# Patient Record
Sex: Female | Born: 1990 | Race: White | Hispanic: No | Marital: Married | Smoking: Never smoker
Health system: Southern US, Community
[De-identification: ages and names within clinical notes are randomized; demographics above are authoritative.]

## PROBLEM LIST (undated history)

## (undated) HISTORY — PX: KNEE SURGERY: SHX244

## (undated) HISTORY — PX: TONSILLECTOMY: SUR1361

---
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
## 2012-12-30 ENCOUNTER — Encounter (HOSPITAL_COMMUNITY): Payer: Self-pay | Admitting: *Deleted

## 2012-12-30 ENCOUNTER — Emergency Department (INDEPENDENT_AMBULATORY_CARE_PROVIDER_SITE_OTHER)
Admission: EM | Admit: 2012-12-30 | Discharge: 2012-12-30 | Disposition: A | Discharge status: Home / Self Care | Payer: BC Managed Care – PPO | Source: Home / Self Care | Attending: Family Medicine | Admitting: Family Medicine

## 2012-12-30 DIAGNOSIS — J111 Influenza due to unidentified influenza virus with other respiratory manifestations: Secondary | ICD-10-CM

## 2012-12-30 NOTE — ED Notes (Signed)
Pt    Reports       Symptoms     Of    Cough  Congested  Body  Aches        Malaise                 Symptoms  Have   Persisted        X    sev  Weeks                She  Is    Wearing            A  Mask                 And  Is  In a  Private  Room         She   Is  Speaking in  Complete  sentances   And is  In no severe  Distress

## 2012-12-30 NOTE — ED Provider Notes (Signed)
History     CSN: [id number]  Arrival date & time 12/30/12  1446   First MD Initiated Contact with Patient 12/30/12 1521      Chief Complaint  Patient presents with  . URI    (Consider location/radiation/quality/duration/timing/severity/associated sxs/prior treatment) Patient is a 22 y.o. female presenting with URI. The history is provided by the patient.  URI The primary symptoms include fever, fatigue, cough, nausea and myalgias. Primary symptoms do not include sore throat, vomiting or rash. The current episode started more than 1 week ago. This is a new problem. The problem has not changed since onset. The onset of the illness is associated with exposure to sick contacts. Symptoms associated with the illness include chills, congestion and rhinorrhea.    History reviewed. No pertinent past medical history.  History reviewed. No pertinent past surgical history.  No family history on file.  History  Substance Use Topics  . Smoking status: Current Every Day Smoker  . Smokeless tobacco: Not on file  . Alcohol Use: Yes    OB History    Grav Para Term Preterm Abortions TAB SAB Ect Mult Living                  Review of Systems  Constitutional: Positive for fever, chills and fatigue.  HENT: Positive for congestion, rhinorrhea and postnasal drip. Negative for sore throat.   Respiratory: Positive for cough.   Gastrointestinal: Positive for nausea. Negative for vomiting.  Musculoskeletal: Positive for myalgias.  Skin: Negative for rash.    Allergies  Ceclor and Penicillins  Home Medications  No current outpatient prescriptions on file.  BP 122/80  Pulse 72  Temp 98.6 F (37 C) (Oral)  Resp 16  SpO2 100%  LMP 12/02/2012  Physical Exam  Nursing note and vitals reviewed. Constitutional: She is oriented to person, place, and time. She appears well-developed and well-nourished.  HENT:  Head: Normocephalic.  Right Ear: External ear normal.  Left Ear: External ear  normal.  Mouth/Throat: Oropharynx is clear and moist.  Eyes: Pupils are equal, round, and reactive to light.  Neck: Normal range of motion. Neck supple.  Cardiovascular: Normal rate, regular rhythm, normal heart sounds and intact distal pulses.   Pulmonary/Chest: Effort normal and breath sounds normal.  Abdominal: Soft. Bowel sounds are normal.  Lymphadenopathy:    She has no cervical adenopathy.  Neurological: She is alert and oriented to person, place, and time.  Skin: Skin is warm and dry.    ED Course  Procedures (including critical care time)  Labs Reviewed - No data to display No results found.   1. Influenza-like illness       MDM          Linna Hoff, MD 12/30/12 [phone]

## 2014-01-23 ENCOUNTER — Emergency Department (HOSPITAL_COMMUNITY)
Admission: EM | Admit: 2014-01-23 | Discharge: 2014-01-23 | Disposition: A | Discharge status: Home / Self Care | Payer: 59 | Source: Home / Self Care | Attending: Emergency Medicine | Admitting: Emergency Medicine

## 2014-01-23 ENCOUNTER — Emergency Department (HOSPITAL_COMMUNITY): Payer: 59

## 2014-01-23 ENCOUNTER — Encounter (HOSPITAL_COMMUNITY): Payer: Self-pay | Admitting: Emergency Medicine

## 2014-01-23 ENCOUNTER — Emergency Department (INDEPENDENT_AMBULATORY_CARE_PROVIDER_SITE_OTHER): Payer: 59

## 2014-01-23 DIAGNOSIS — M25569 Pain in unspecified knee: Secondary | ICD-10-CM

## 2014-01-23 DIAGNOSIS — M25562 Pain in left knee: Secondary | ICD-10-CM

## 2014-01-23 IMAGING — CR DG KNEE COMPLETE 4+V*L*
1 series · 1 of 1 positions shown · non-contrast
Comparison: None.

CLINICAL DATA: Left knee pain.

EXAM:
LEFT KNEE - COMPLETE 4+ VIEW

[view not recorded]
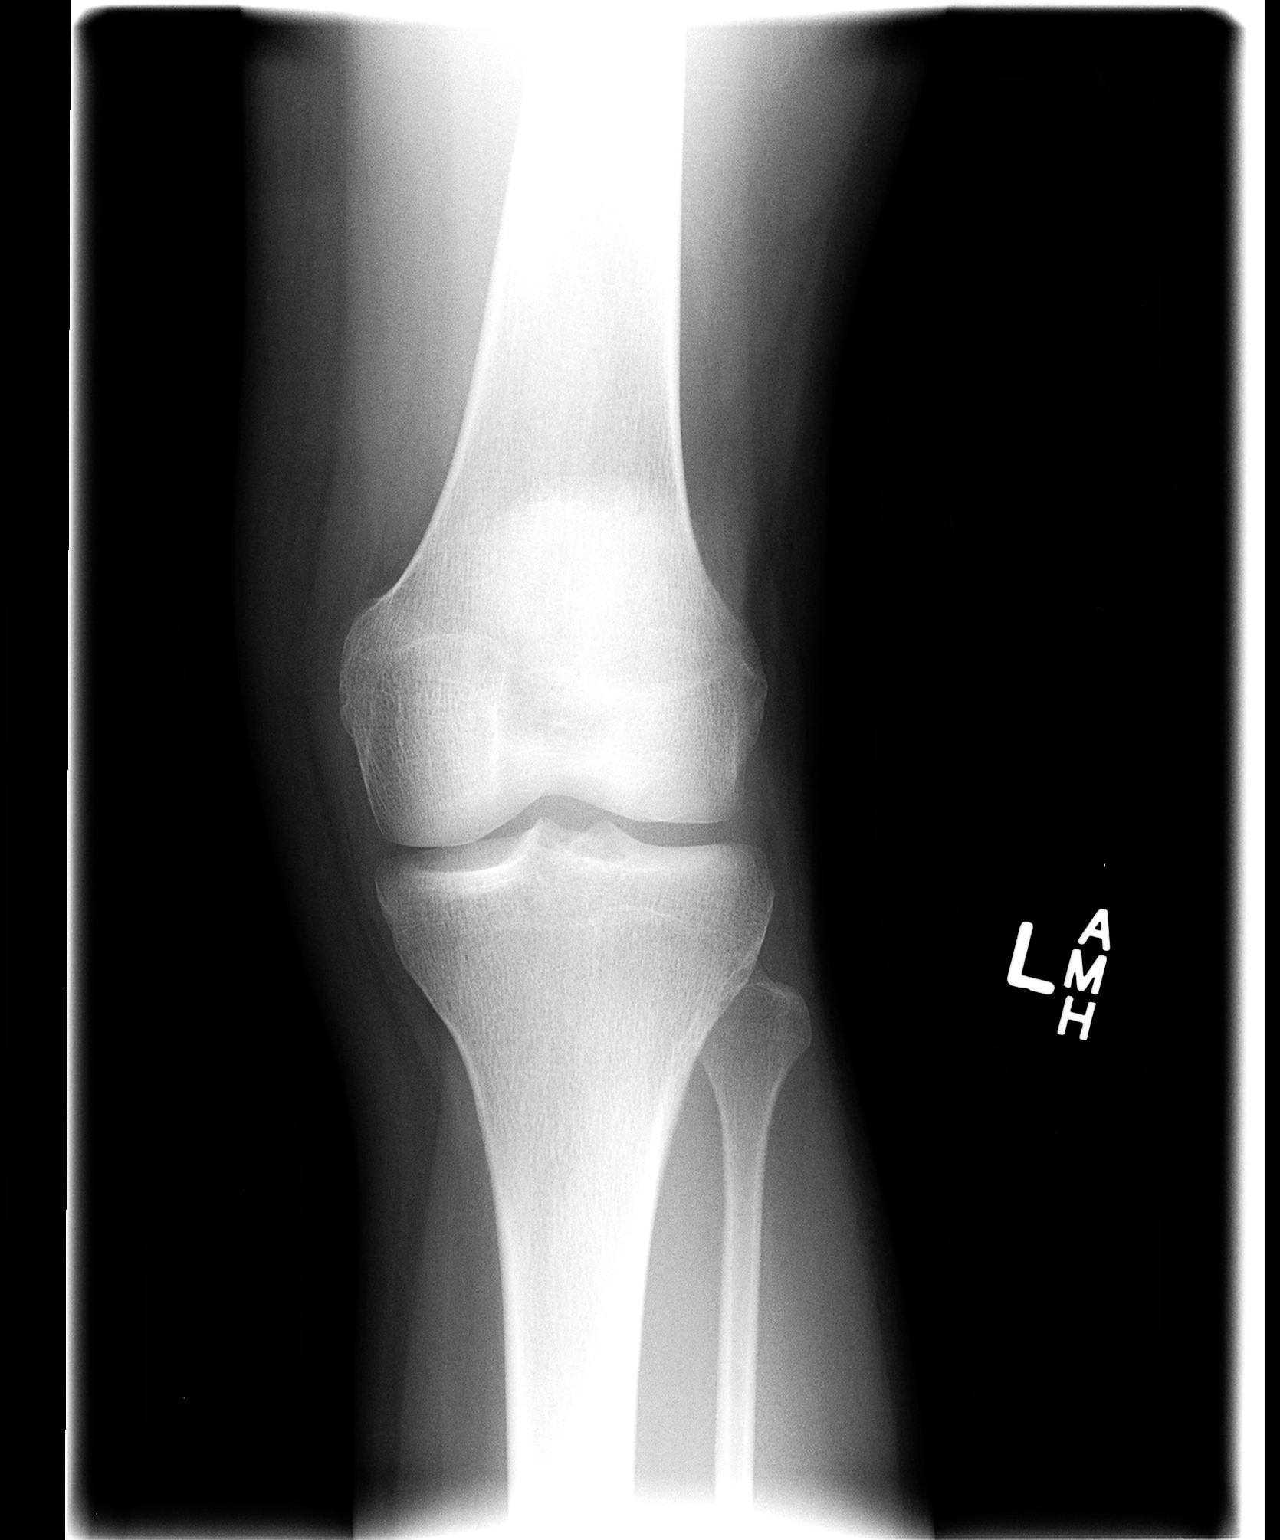

[1 of 1 positions shown; findings below may reference images not displayed]

FINDINGS: There is no evidence of fracture, dislocation, or joint effusion.
There is no evidence of arthropathy or other focal bone abnormality.
Soft tissues are unremarkable.
IMPRESSION: Negative exam.

## 2014-01-23 IMAGING — CR DG KNEE COMPLETE 4+V*L*
1 series · 1 of 1 positions shown · non-contrast
Comparison: None.

CLINICAL DATA: Left knee pain.

EXAM:
LEFT KNEE - COMPLETE 4+ VIEW

[view not recorded]
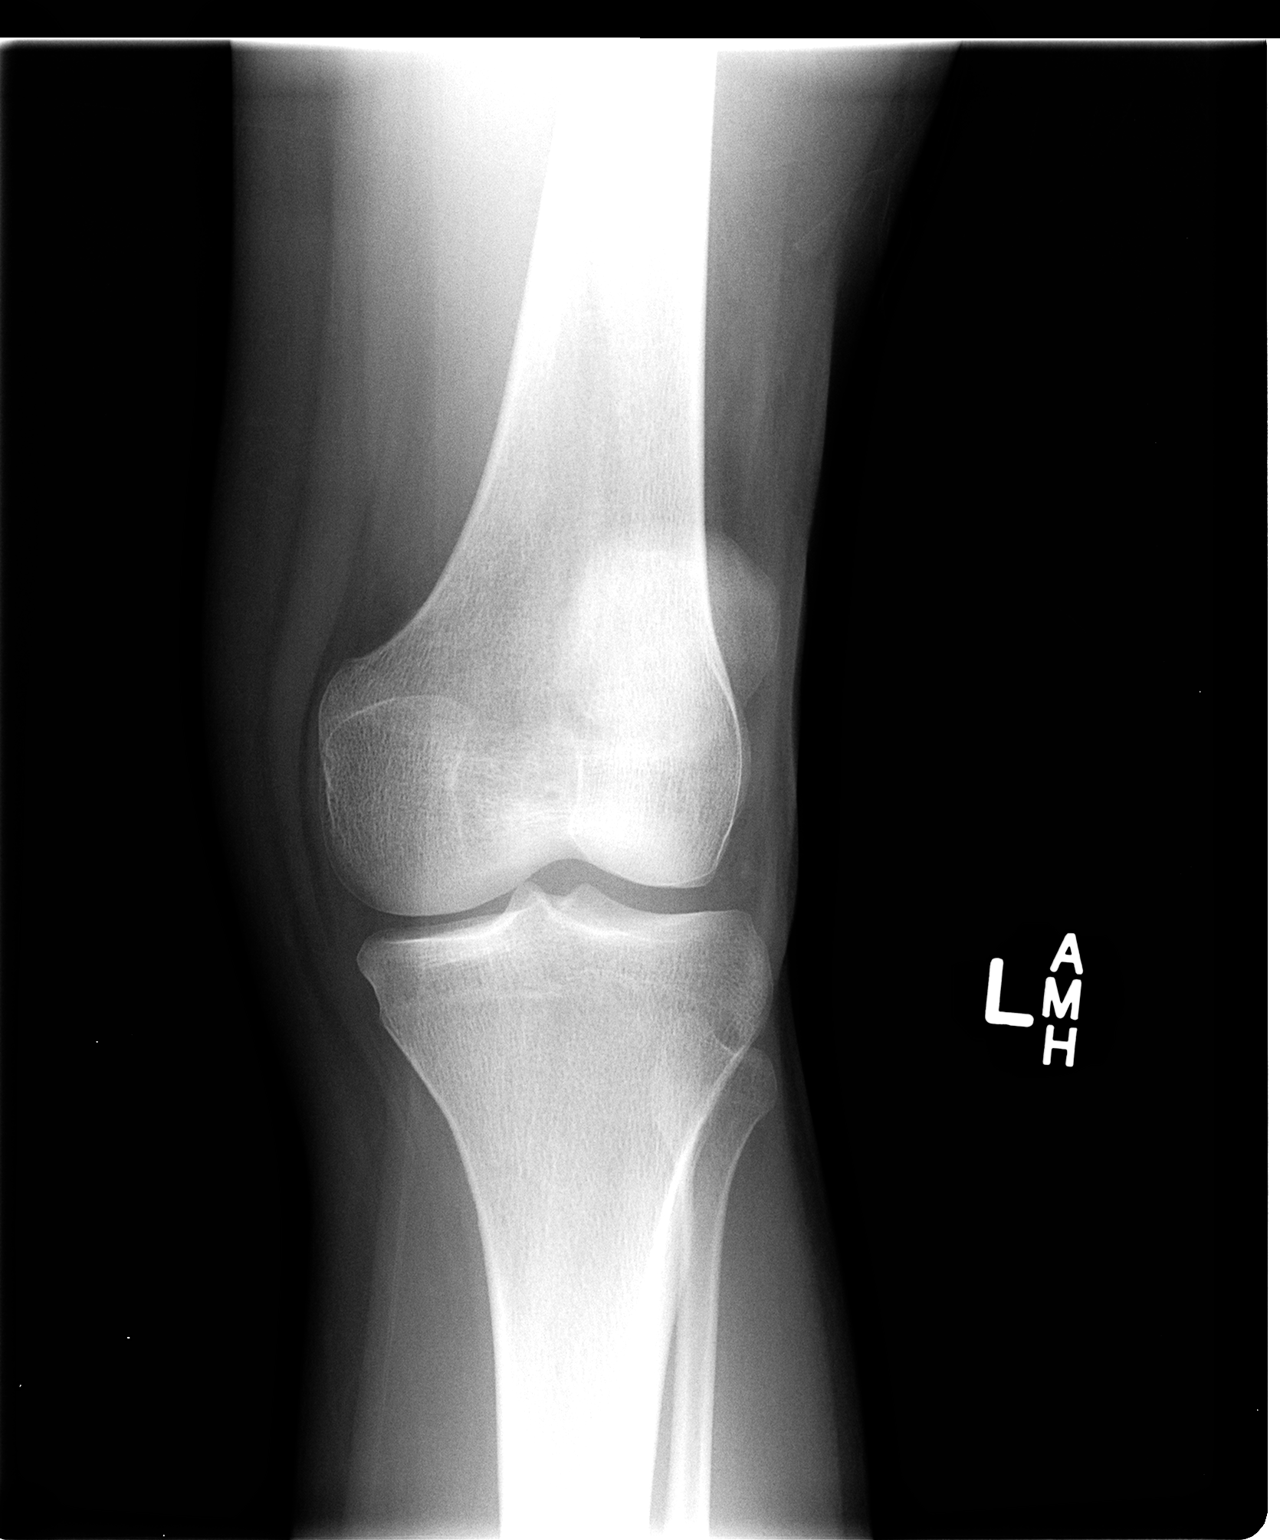

[1 of 1 positions shown; findings below may reference images not displayed]

FINDINGS: There is no evidence of fracture, dislocation, or joint effusion.
There is no evidence of arthropathy or other focal bone abnormality.
Soft tissues are unremarkable.
IMPRESSION: Negative exam.

## 2014-01-23 IMAGING — CR DG KNEE COMPLETE 4+V*L*
1 series · 1 of 1 positions shown · non-contrast
Comparison: None.

CLINICAL DATA: Left knee pain.

EXAM:
LEFT KNEE - COMPLETE 4+ VIEW

[view not recorded]
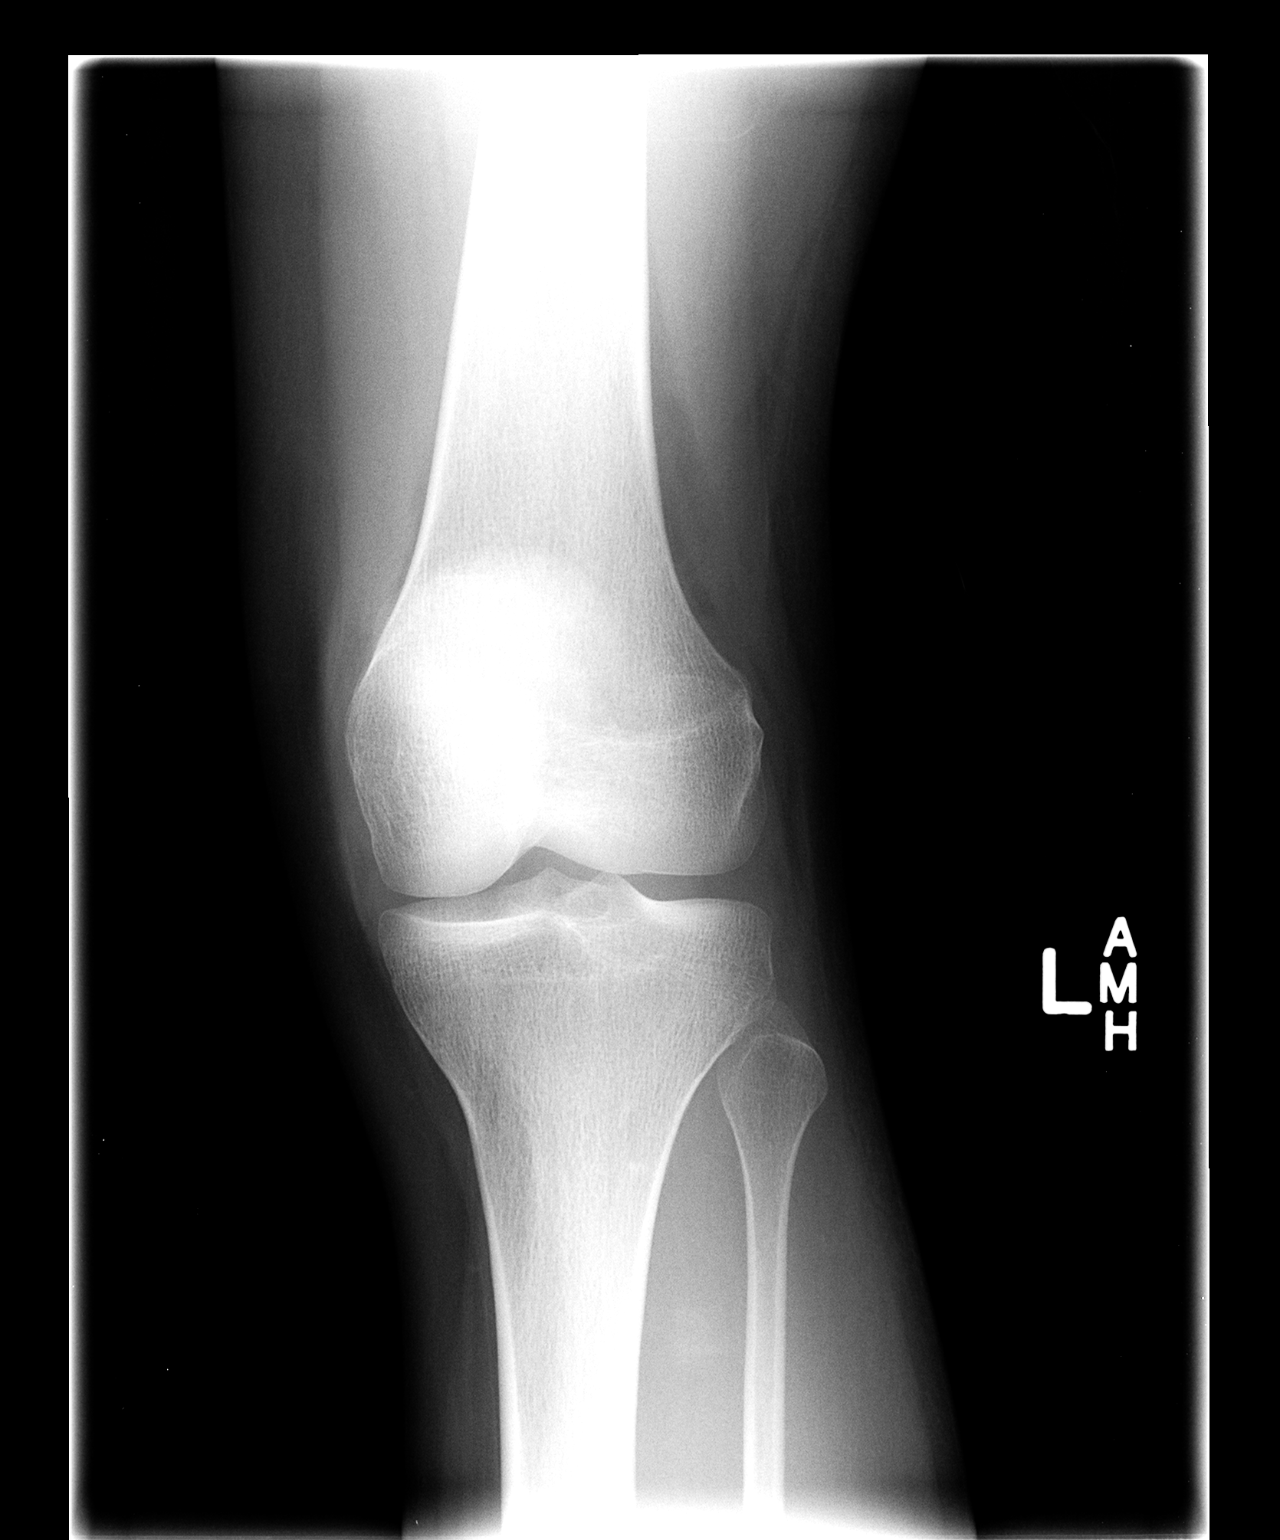

[1 of 1 positions shown; findings below may reference images not displayed]

FINDINGS: There is no evidence of fracture, dislocation, or joint effusion.
There is no evidence of arthropathy or other focal bone abnormality.
Soft tissues are unremarkable.
IMPRESSION: Negative exam.

## 2014-01-23 IMAGING — CR DG KNEE STANDING AP BILAT
1 series · 1 of 1 positions shown · non-contrast
Comparison: None.

CLINICAL DATA: Left knee pain.

EXAM:
BILATERAL KNEES STANDING - 1 VIEW

[view not recorded]
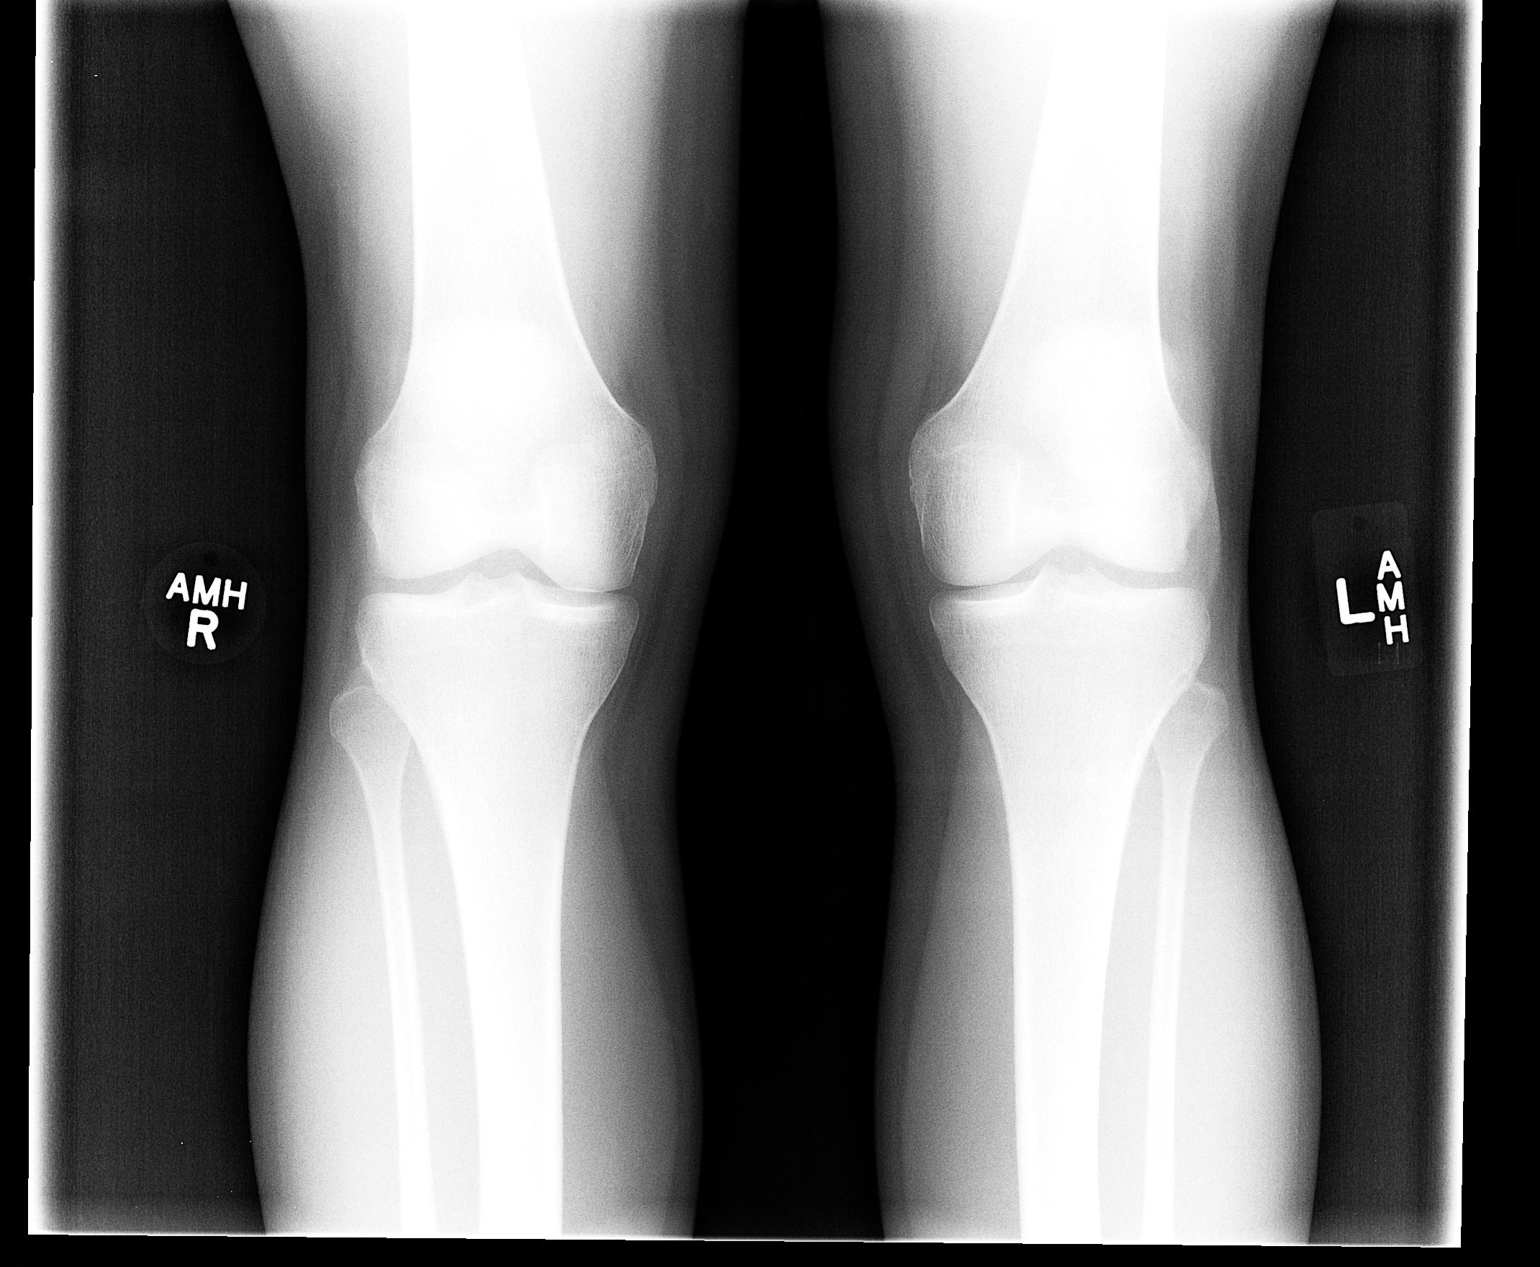

[1 of 1 positions shown; findings below may reference images not displayed]

FINDINGS: There is no evidence of fracture, dislocation, or joint effusion.
There is no evidence of arthropathy or other focal bone abnormality.
Soft tissues are unremarkable.
IMPRESSION: Negative exam.

## 2014-01-23 IMAGING — CR DG KNEE COMPLETE 4+V*L*
1 series · 1 of 1 positions shown · non-contrast
Comparison: None.

CLINICAL DATA: Left knee pain.

EXAM:
LEFT KNEE - COMPLETE 4+ VIEW

[view not recorded]
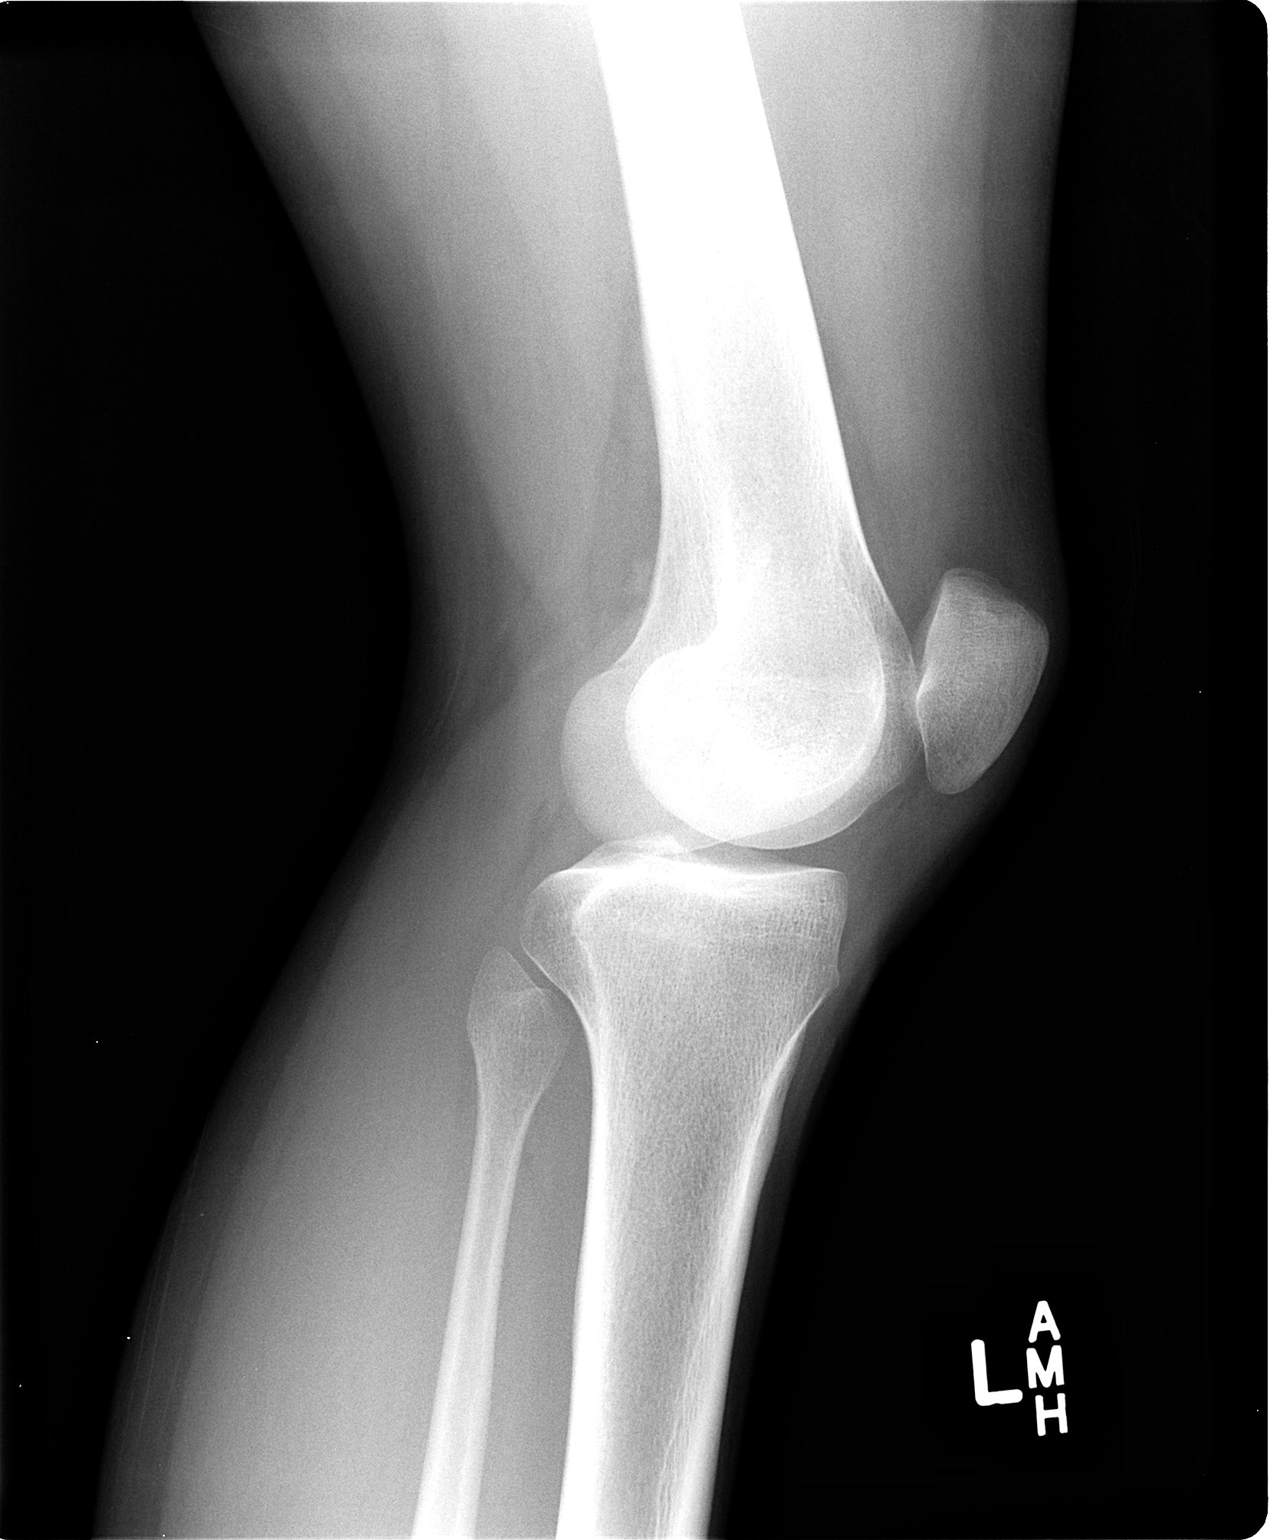

[1 of 1 positions shown; findings below may reference images not displayed]

FINDINGS: There is no evidence of fracture, dislocation, or joint effusion.
There is no evidence of arthropathy or other focal bone abnormality.
Soft tissues are unremarkable.
IMPRESSION: Negative exam.

## 2014-01-23 IMAGING — CR DG KNEE COMPLETE 4+V*L*
1 series · 1 of 1 positions shown · non-contrast
Comparison: None.

CLINICAL DATA: Left knee pain.

EXAM:
LEFT KNEE - COMPLETE 4+ VIEW

[view not recorded]
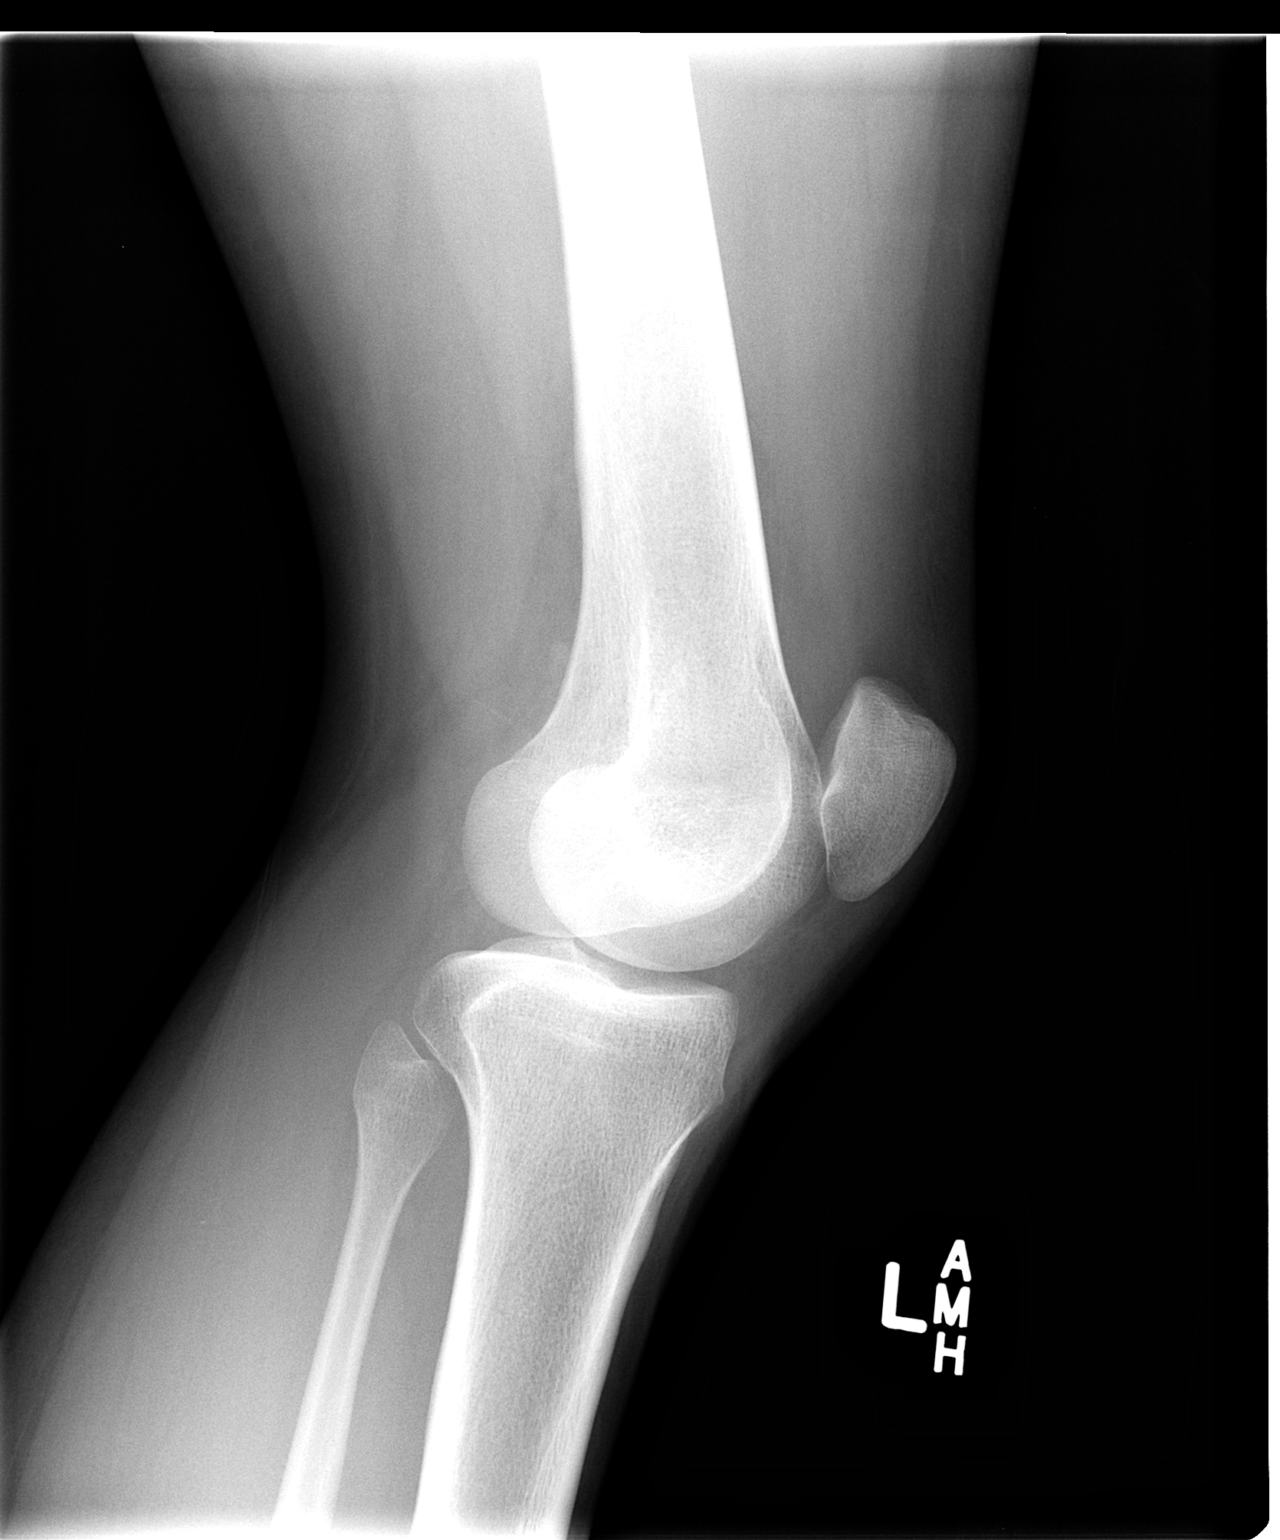

[1 of 1 positions shown; findings below may reference images not displayed]

FINDINGS: There is no evidence of fracture, dislocation, or joint effusion.
There is no evidence of arthropathy or other focal bone abnormality.
Soft tissues are unremarkable.
IMPRESSION: Negative exam.

## 2014-01-23 MED ORDER — METHYLPREDNISOLONE ACETATE 80 MG/ML IJ SUSP
INTRAMUSCULAR | Status: AC
  Filled 2014-01-23: qty 1

## 2014-01-23 MED ORDER — METHYLPREDNISOLONE ACETATE 40 MG/ML IJ SUSP
80.0000 mg | Freq: Once | INTRAMUSCULAR | Status: AC
  Administered 2014-01-23: 80 mg via INTRAMUSCULAR

## 2014-01-23 MED ORDER — MELOXICAM 15 MG PO TABS: 15.0000 mg | ORAL_TABLET | Freq: Every day | ORAL | Status: DC

## 2014-01-23 MED ORDER — KETOROLAC TROMETHAMINE 60 MG/2ML IM SOLN
60.0000 mg | Freq: Once | INTRAMUSCULAR | Status: AC
  Administered 2014-01-23: 60 mg via INTRAMUSCULAR

## 2014-01-23 MED ORDER — TRAMADOL HCL 50 MG PO TABS: 50.0000 mg | ORAL_TABLET | Freq: Four times a day (QID) | ORAL | Status: DC | PRN

## 2014-01-23 MED ORDER — KETOROLAC TROMETHAMINE 60 MG/2ML IM SOLN
INTRAMUSCULAR | Status: AC
  Filled 2014-01-23: qty 2

## 2014-01-23 NOTE — Discharge Instructions (Signed)
Knee Pain Knee pain can be a result of an injury or other medical conditions. Treatment will depend on the cause of your pain. HOME CARE  Only take medicine as told by your doctor.  Keep a healthy weight. Being overweight can make the knee hurt more.  Stretch before exercising or playing sports.  If there is constant knee pain, change the way you exercise. Ask your doctor for advice.  Make sure shoes fit well. Choose the right shoe for the sport or activity.  Protect your knees. Wear kneepads if needed.  Rest when you are tired. GET HELP RIGHT AWAY IF:   Your knee pain does not stop.  Your knee pain does not get better.  Your knee joint feels hot to the touch.  You have a fever. MAKE SURE YOU:   Understand these instructions.  Will watch this condition.  Will get help right away if you are not doing well or get worse. Document Released: 03/05/2009 Document Revised: 02/29/2012 Document Reviewed: 03/05/2009 [employer] Patient Information 2014 [employer].  

## 2014-01-23 NOTE — ED Notes (Signed)
C/o left knee pain x 1 wk.  No known injury.  States wears steel toe shoes and does a lot of standing at work.  Pt describes the pain as achy all over with stiffness/tightness.  Pt has tried ibuprofen and ice with no relief.

## 2014-01-23 NOTE — ED Provider Notes (Signed)
Medical screening examination/treatment/procedure(s) were performed by non-physician practitioner and as supervising physician I was immediately available for consultation/collaboration.  Roselie Cirigliano, M.D.  Jasia Hiltunen C Xaine Sansom, MD 01/23/14 2159 

## 2014-01-23 NOTE — ED Provider Notes (Signed)
CSN: [id number]     Arrival date & time 01/23/14  1730 History   First MD Initiated Contact with Patient 01/23/14 1811     No chief complaint on file.  (Consider location/radiation/quality/duration/timing/severity/associated sxs/prior Treatment) HPI Comments: 23 year old female presents complaining of left knee pain. For 3 days, she has constant, worsening pain in the left knee. She has a remote history of knee surgery when she was much younger and has always had some mild knee problems, but nothing this severe. The pain is present when she first wakes up and gets worse throughout the day. She has tried icing the knee and taking Advil, these provide some mild relief but the pain is still significant. It is now affecting her ability to continue to work. For the past 3 weeks, she has been doing a job that requires being on her feet all day wearing steel toed boots, she wonders if this could be contributing to knee pain. She denies any specific injury to the knee. She notes some possible mild swelling but nothing significant.   History reviewed. No pertinent past medical history. Past Surgical History  Procedure Laterality Date  . Knee surgery    . Tonsillectomy     History reviewed. No pertinent family history. History  Substance Use Topics  . Smoking status: Current Every Day Smoker  . Smokeless tobacco: Not on file  . Alcohol Use: Yes   OB History   Grav Para Term Preterm Abortions TAB SAB Ect Mult Living                 Review of Systems  Constitutional: Negative for fever and chills.  Eyes: Negative for visual disturbance.  Respiratory: Negative for cough and shortness of breath.   Cardiovascular: Negative for chest pain, palpitations and leg swelling.  Gastrointestinal: Negative for nausea, vomiting and abdominal pain.  Endocrine: Negative for polydipsia and polyuria.  Genitourinary: Negative for dysuria, urgency and frequency.  Musculoskeletal: Positive for arthralgias (left  knee pain). Negative for myalgias.  Skin: Negative for rash.  Neurological: Negative for dizziness, weakness and light-headedness.    Allergies  Ceclor and Penicillins  Home Medications   Current Outpatient Rx  Name  Route  Sig  Dispense  Refill  . meloxicam (MOBIC) 15 MG tablet   Oral   Take 1 tablet (15 mg total) by mouth daily.   30 tablet   0   . traMADol (ULTRAM) 50 MG tablet   Oral   Take 1 tablet (50 mg total) by mouth every 6 (six) hours as needed.   20 tablet   0    BP 135/80  Pulse 90  Temp(Src) 98.5 F (36.9 C) (Oral)  Resp 16  SpO2 100%  LMP 01/09/2014 Physical Exam  Nursing note and vitals reviewed. Constitutional: She is oriented to person, place, and time. Vital signs are normal. She appears well-developed and well-nourished. No distress.  HENT:  Head: Normocephalic and atraumatic.  Pulmonary/Chest: Effort normal. No respiratory distress.  Musculoskeletal:       Left knee: She exhibits normal range of motion, no swelling, no effusion, no erythema, normal alignment, no LCL laxity, normal patellar mobility, no bony tenderness, normal meniscus and no MCL laxity. Tenderness found. Medial joint line and lateral joint line tenderness noted. No MCL, no LCL and no patellar tendon tenderness noted.  Midline surgical scar over the left knee from remote surgery   Neurological: She is alert and oriented to person, place, and time. She has normal strength. Coordination  normal.  Skin: Skin is warm and dry. No rash noted. She is not diaphoretic.  Psychiatric: She has a normal mood and affect. Judgment normal.    ED Course  Procedures (including critical care time) Labs Review Labs Reviewed - No data to display Imaging Review Dg Knee Bilateral Standing Ap  01/23/2014   CLINICAL DATA:  Left knee pain.  EXAM: BILATERAL KNEES STANDING - 1 VIEW  COMPARISON:  None.  FINDINGS: There is no evidence of fracture, dislocation, or joint effusion. There is no evidence of  arthropathy or other focal bone abnormality. Soft tissues are unremarkable.  IMPRESSION: Negative exam.   Electronically Signed   By: Drusilla Kanner M.D.   On: 01/23/2014 19:48   Dg Knee Complete 4 Views Left  01/23/2014   CLINICAL DATA:  Left knee pain.  EXAM: LEFT KNEE - COMPLETE 4+ VIEW  COMPARISON:  None.  FINDINGS: There is no evidence of fracture, dislocation, or joint effusion. There is no evidence of arthropathy or other focal bone abnormality. Soft tissues are unremarkable.  IMPRESSION: Negative exam.   Electronically Signed   By: Drusilla Kanner M.D.   On: 01/23/2014 19:48      MDM   1. Knee pain, left    X-rays negative. Most likely mild arthritis. Giving Toradol and Depo-Medrol here, and discharging on meloxicam and tramadol when necessary. Followup with orthopedics if not improving   Meds ordered this encounter  Medications  . ketorolac (TORADOL) injection 60 mg    Sig:   . methylPREDNISolone acetate (DEPO-MEDROL) injection 80 mg    Sig:   . meloxicam (MOBIC) 15 MG tablet    Sig: Take 1 tablet (15 mg total) by mouth daily.    Dispense:  30 tablet    Refill:  0  . traMADol (ULTRAM) 50 MG tablet    Sig: Take 1 tablet (50 mg total) by mouth every 6 (six) hours as needed.    Dispense:  20 tablet    Refill:  0       Graylon Good, PA-C 01/23/14 2015

## 2017-02-12 DIAGNOSIS — N390 Urinary tract infection, site not specified: Secondary | ICD-10-CM | POA: Diagnosis not present

## 2017-02-12 DIAGNOSIS — Z6820 Body mass index (BMI) 20.0-20.9, adult: Secondary | ICD-10-CM | POA: Diagnosis not present

## 2017-05-23 DIAGNOSIS — J209 Acute bronchitis, unspecified: Secondary | ICD-10-CM | POA: Diagnosis not present

## 2017-06-17 DIAGNOSIS — R300 Dysuria: Secondary | ICD-10-CM | POA: Diagnosis not present

## 2017-06-17 DIAGNOSIS — Z124 Encounter for screening for malignant neoplasm of cervix: Secondary | ICD-10-CM | POA: Diagnosis not present

## 2017-06-17 DIAGNOSIS — Z01419 Encounter for gynecological examination (general) (routine) without abnormal findings: Secondary | ICD-10-CM | POA: Diagnosis not present

## 2017-07-07 DIAGNOSIS — N925 Other specified irregular menstruation: Secondary | ICD-10-CM | POA: Diagnosis not present

## 2017-07-07 DIAGNOSIS — R87612 Low grade squamous intraepithelial lesion on cytologic smear of cervix (LGSIL): Secondary | ICD-10-CM | POA: Diagnosis not present

## 2017-09-25 DIAGNOSIS — Z6820 Body mass index (BMI) 20.0-20.9, adult: Secondary | ICD-10-CM | POA: Diagnosis not present

## 2017-10-22 DIAGNOSIS — Z6820 Body mass index (BMI) 20.0-20.9, adult: Secondary | ICD-10-CM | POA: Diagnosis not present

## 2017-12-28 DIAGNOSIS — J019 Acute sinusitis, unspecified: Secondary | ICD-10-CM | POA: Diagnosis not present

## 2018-01-28 DIAGNOSIS — Z3202 Encounter for pregnancy test, result negative: Secondary | ICD-10-CM | POA: Diagnosis not present

## 2018-01-28 DIAGNOSIS — Z7689 Persons encountering health services in other specified circumstances: Secondary | ICD-10-CM | POA: Diagnosis not present

## 2018-01-28 DIAGNOSIS — R87612 Low grade squamous intraepithelial lesion on cytologic smear of cervix (LGSIL): Secondary | ICD-10-CM | POA: Diagnosis not present

## 2018-02-10 DIAGNOSIS — R5383 Other fatigue: Secondary | ICD-10-CM | POA: Diagnosis not present

## 2021-04-02 ENCOUNTER — Encounter (HOSPITAL_COMMUNITY): Payer: Self-pay

## 2021-04-02 ENCOUNTER — Emergency Department (HOSPITAL_COMMUNITY): Payer: Self-pay

## 2021-04-02 ENCOUNTER — Other Ambulatory Visit: Payer: Self-pay

## 2021-04-02 ENCOUNTER — Emergency Department (HOSPITAL_COMMUNITY)
Admission: EM | Admit: 2021-04-02 | Discharge: 2021-04-02 | Disposition: A | Discharge status: Home / Self Care | Payer: Self-pay | Attending: Emergency Medicine | Admitting: Emergency Medicine

## 2021-04-02 DIAGNOSIS — F17200 Nicotine dependence, unspecified, uncomplicated: Secondary | ICD-10-CM | POA: Insufficient documentation

## 2021-04-02 DIAGNOSIS — X58XXXA Exposure to other specified factors, initial encounter: Secondary | ICD-10-CM | POA: Insufficient documentation

## 2021-04-02 DIAGNOSIS — S161XXA Strain of muscle, fascia and tendon at neck level, initial encounter: Secondary | ICD-10-CM | POA: Insufficient documentation

## 2021-04-02 IMAGING — CT CT CERVICAL SPINE W/O CM
3 of 4 series · 13 of 35 positions shown, 16 images · non-contrast
Comparison: None.

CLINICAL DATA: Cervicalgia

EXAM:
CT CERVICAL SPINE WITHOUT CONTRAST
TECHNIQUE: Multidetector CT imaging of the cervical spine was performed without
intravenous contrast. Multiplanar CT image reconstructions were also
generated.

[Series 8: sag bone · sagittal · 0.31mm/px · 5 of 88 slices shown, 6 images]
[im 30/88  bone]
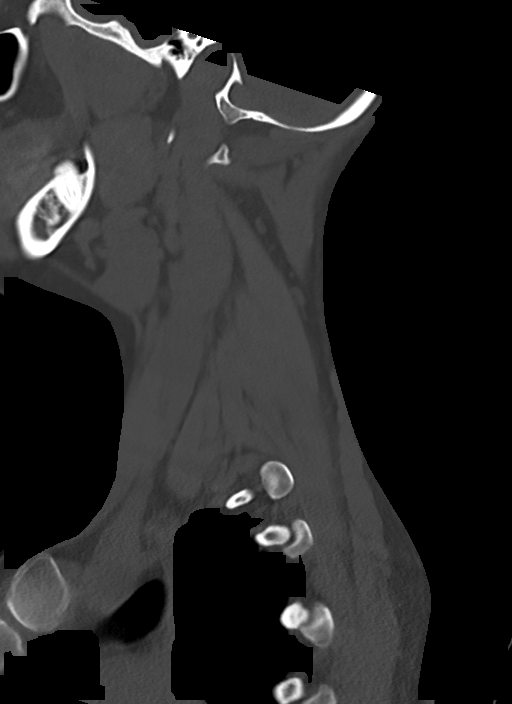
[im 37/88  bone]
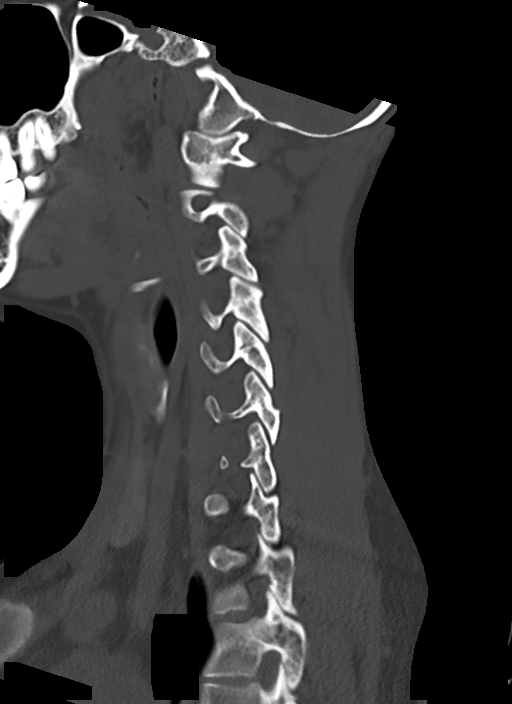
[im 44/88  soft-tissue]
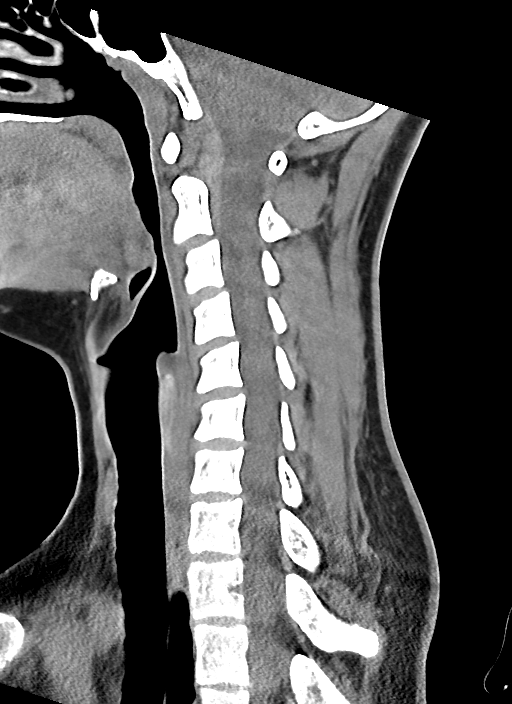
[im 44/88  bone]
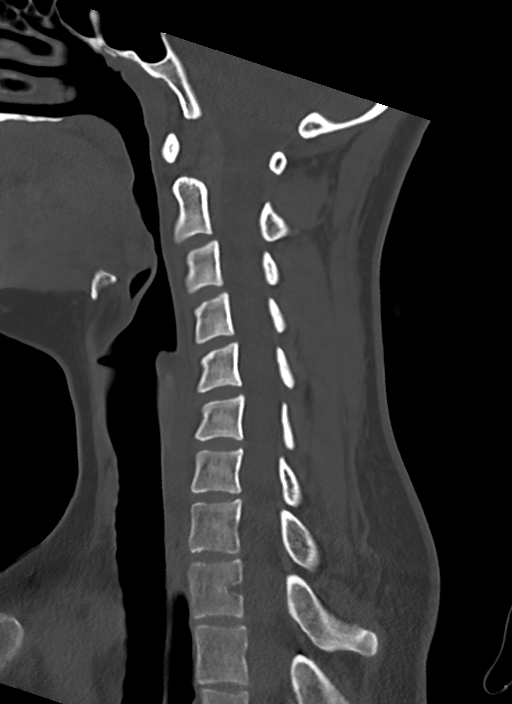
[im 51/88  bone]
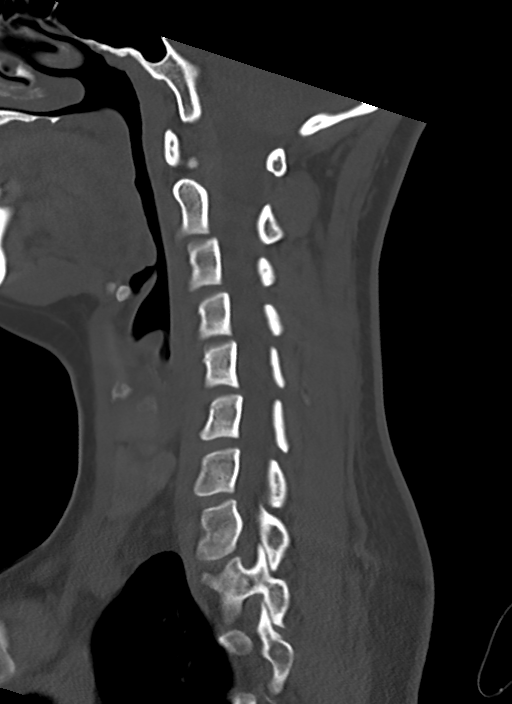
[im 59/88  bone]
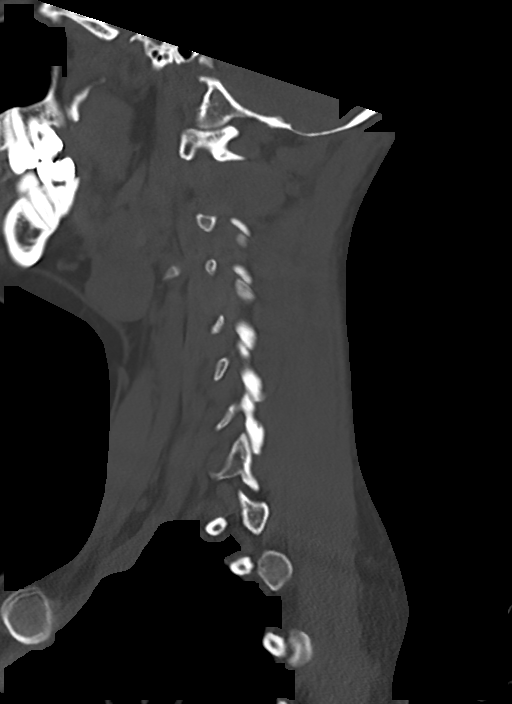

[Series 9: cor bone · coronal · 0.34mm/px · 3 of 89 slices shown]
[im 28/89  bone]
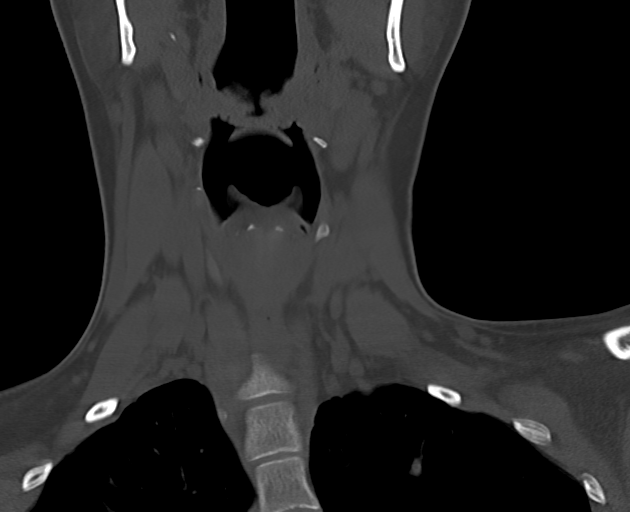
[im 39/89  bone]
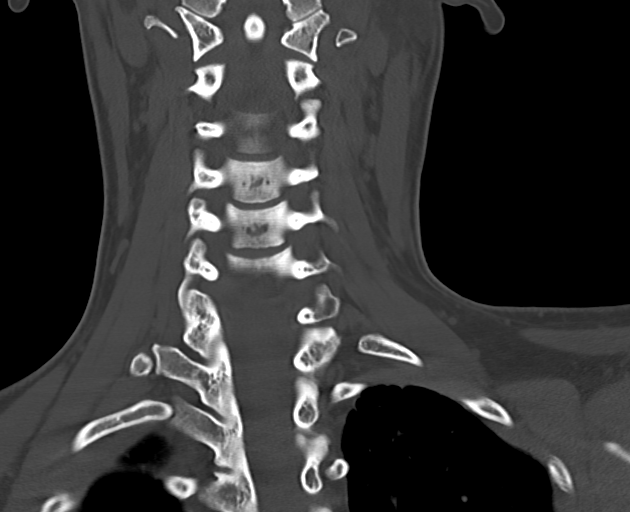
[im 50/89  bone]
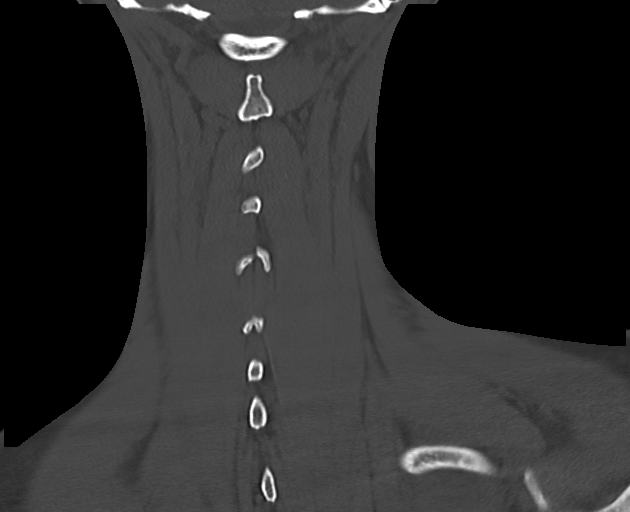

[Series 10: orthogonal axials · axial · 0.21mm/px · z∈[+898,+1028]mm · 5 of 110 slices shown, 7 images]
[im 19/110  soft-tissue]
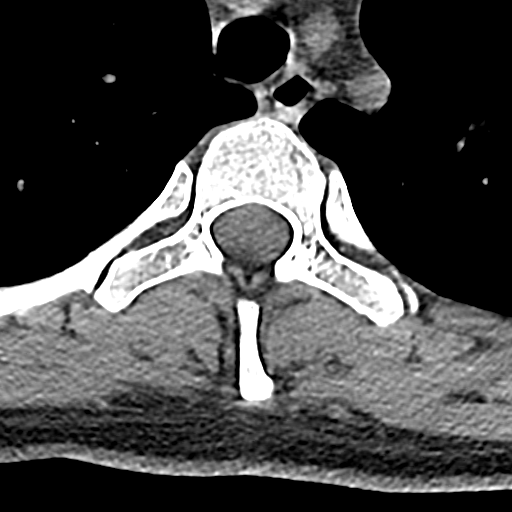
[im 19/110  bone]
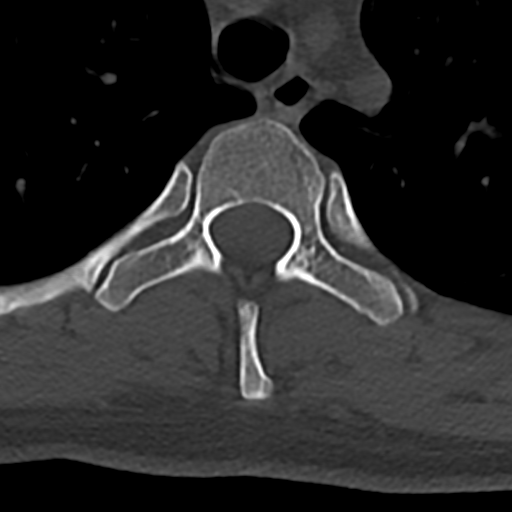
[im 37/110  bone]
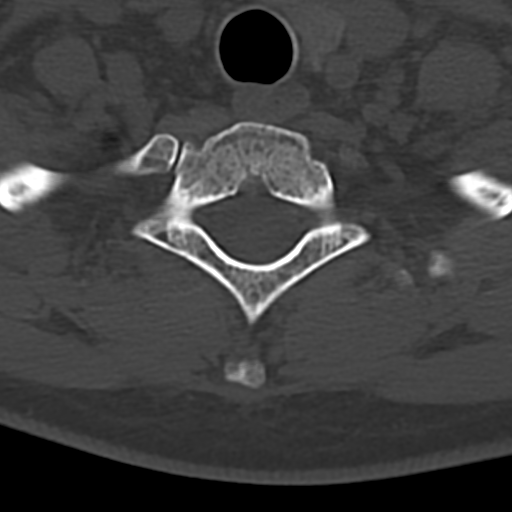
[im 55/110  bone]
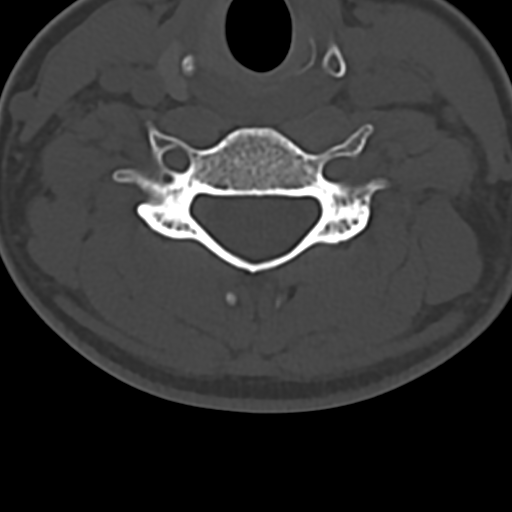
[im 73/110  bone]
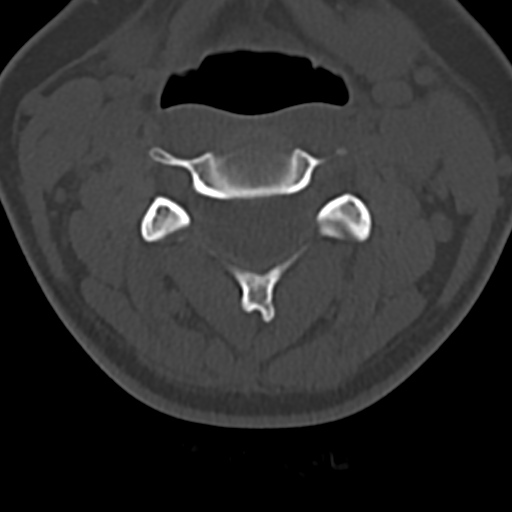
[im 91/110  soft-tissue]
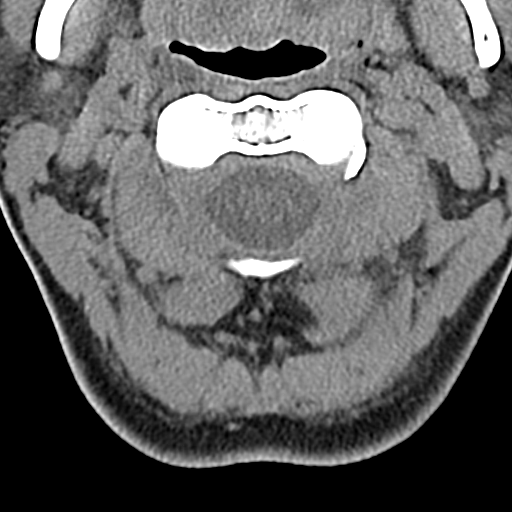
[im 91/110  bone]
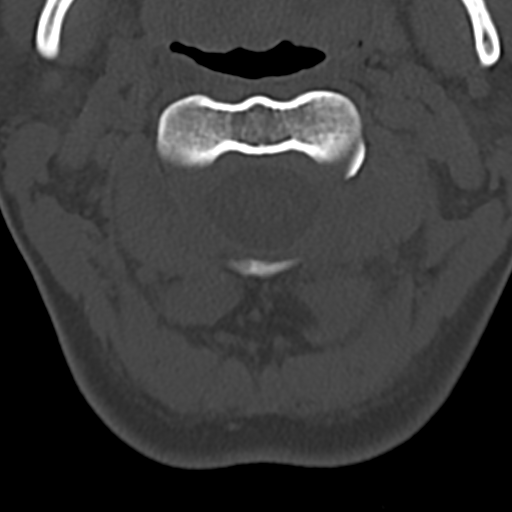

[13 of 35 positions shown; findings below may reference images not displayed]

FINDINGS: Alignment: There is slight upper thoracic levoscoliosis. No evidence
spondylolisthesis.

Skull base and vertebrae: Skull base and craniocervical junction
regions appear normal. No evident fracture. No blastic or lytic bone
lesions.

Soft tissues and spinal canal: Prevertebral soft tissues and
predental space regions are normal. No cord canal hematoma. No
paraspinous lesions.

Disc levels:  Disc spaces appear normal.

At C2-3, there is no nerve root edema or effacement. No appreciable
facet hypertrophy. No disc extrusion or stenosis.

At C3-4, there is no appreciable facet hypertrophy. No nerve root
edema or effacement. No disc extrusion or stenosis.

At C4-5, there is no appreciable facet hypertrophy. No nerve root
edema or effacement. No disc extrusion or stenosis.

At C5-6, there is no appreciable facet hypertrophy. No nerve root
edema or effacement. No disc extrusion or stenosis.

At C6-7, no appreciable facet hypertrophy. No nerve root edema or
effacement. No disc extrusion or stenosis.

At C7-T1, no appreciable facet hypertrophy. No nerve root edema or
effacement. No disc extrusion or stenosis.

Upper chest: Visualized upper lung regions are clear.

Other: None
IMPRESSION: No nerve root edema or effacement. No disc extrusion or stenosis. No
appreciable arthropathic change.

No fracture or spondylolisthesis. There is slight upper thoracic
levoscoliosis.

## 2021-04-02 MED ORDER — METHYLPREDNISOLONE 4 MG PO TBPK: ORAL_TABLET | ORAL | 0 refills | Status: DC

## 2021-04-02 MED ORDER — KETOROLAC TROMETHAMINE 60 MG/2ML IM SOLN
60.0000 mg | Freq: Once | INTRAMUSCULAR | Status: AC
  Administered 2021-04-02: 60 mg via INTRAMUSCULAR
  Filled 2021-04-02: qty 2

## 2021-04-02 MED ORDER — METHOCARBAMOL 500 MG PO TABS: 500.0000 mg | ORAL_TABLET | Freq: Two times a day (BID) | ORAL | 0 refills | Status: DC

## 2021-04-02 NOTE — ED Provider Notes (Signed)
[facility] EMERGENCY DEPARTMENT Provider Note   CSN: [id number] Arrival date & time: 04/02/21  1216     History Chief Complaint  Patient presents with  . Neck Pain    Caroline Graham is a 30 y.o. female.  HPI 30 year old female with no sniffing medical history presents to the ER with complaints of neck pain.  Sent here from her chiropractor.  Patient reports ongoing neck pain for the last week, mostly on the left side at the base of her skull which does seem to travel into her head.  At times she has pain with movement of her hair, including when the wind blows it.  She denies any numbness or tingling going down her extremities.  No vision changes, nausea, vomiting, facial droop, word slurring.  She has been taking 400 mg of ibuprofen every 4 hours with some relief.  She saw a chiropractor yesterday, who did not adjust her and wanted to see her again today.  When she continued to have same symptoms, she stated that the chiropractor was concerned and sent her here for CT scan.  She had no falls or inciting injuries, though she did state that about a week ago she went for prolonged ride on her motorcycle and was sitting with her body and neck pretty low for prolonged period of time.  She denies any unintended weight loss, night sweats.  No history of IV drug use.    History reviewed. No pertinent past medical history.  There are no problems to display for this patient.   Past Surgical History:  Procedure Laterality Date  . KNEE SURGERY    . TONSILLECTOMY       OB History   No obstetric history on file.     History reviewed. No pertinent family history.  Social History   Tobacco Use  . Smoking status: Current Every Day Smoker  Substance Use Topics  . Alcohol use: Yes  . Drug use: No    Home Medications Prior to Admission medications   Medication Sig Start Date End Date Taking? Authorizing Provider  meloxicam (MOBIC) 15 MG tablet Take 1 tablet  (15 mg total) by mouth daily. 01/23/14   Graylon Good, PA-C  methocarbamol (ROBAXIN) 500 MG tablet Take 1 tablet (500 mg total) by mouth 2 (two) times daily. 04/02/21  Yes Mare Ferrari, PA-C  methylPREDNISolone (MEDROL DOSEPAK) 4 MG TBPK tablet Please take as directed until finished 04/02/21  Yes Riann Oman A, PA-C  traMADol (ULTRAM) 50 MG tablet Take 1 tablet (50 mg total) by mouth every 6 (six) hours as needed. 01/23/14   Graylon Good, PA-C    Allergies    Ceclor [cefaclor] and Penicillins  Review of Systems   Review of Systems  Eyes: Negative for visual disturbance.  Musculoskeletal: Positive for neck pain.  Neurological: Positive for headaches. Negative for weakness and numbness.    Physical Exam Updated Vital Signs BP (!) 149/93 (BP Location: Right Arm)   Pulse (!) 115   Temp 99.1 F (37.3 C)   Resp 14   Ht 5\' 3"  (1.6 m)   Wt 59 kg   LMP 03/18/2021   SpO2 100%   BMI 23.03 kg/m   Physical Exam Vitals and nursing note reviewed.  Constitutional:      General: She is not in acute distress.    Appearance: She is well-developed. She is not ill-appearing or diaphoretic.  HENT:     Head: Normocephalic and atraumatic.  Eyes:     Conjunctiva/sclera: Conjunctivae normal.  Cardiovascular:     Rate and Rhythm: Normal rate and regular rhythm.     Heart sounds: No murmur heard.   Pulmonary:     Effort: Pulmonary effort is normal. No respiratory distress.     Breath sounds: Normal breath sounds.  Abdominal:     Palpations: Abdomen is soft.     Tenderness: There is no abdominal tenderness.  Musculoskeletal:        General: Tenderness present. Normal range of motion.     Cervical back: Neck supple.     Right lower leg: No edema.     Left lower leg: No edema.     Comments: Mild point tenderness to the base of the skull on the left side.  No midline cervical tenderness.  Associate paraspinal muscle tenderness.  Slightly limited range of motion with action and extension  of the neck.  5/5 strength in upper and lower extremities bilaterally.  Skin:    General: Skin is warm and dry.  Neurological:     General: No focal deficit present.     Mental Status: She is alert and oriented to person, place, and time.     Sensory: No sensory deficit.     Motor: No weakness.     Comments: Mental Status:  Alert, thought content appropriate, able to give a coherent history. Speech fluent without evidence of aphasia. Able to follow 2 step commands without difficulty.  Cranial Nerves:  II: Peripheral visual fields grossly normal, pupils equal, round, reactive to light III,IV, VI: ptosis not present, extra-ocular motions intact bilaterally  V,VII: smile symmetric, facial light touch sensation equal VIII: hearing grossly normal to voice  X: uvula elevates symmetrically  XI: bilateral shoulder shrug symmetric and strong XII: midline tongue extension without fassiculations Motor:  Normal tone. 5/5 strength of BUE and BLE major muscle groups including strong and equal grip strength and dorsiflexion/plantar flexion Sensory: light touch normal in all extremities. Cerebellar: normal finger-to-nose with bilateral upper extremities, Romberg sign absent Gait: normal gait and balance. Able to walk on toes and heels with ease.       ED Results / Procedures / Treatments   Labs (all labs ordered are listed, but only abnormal results are displayed) Labs Reviewed - No data to display  EKG None  Radiology CT Cervical Spine Wo Contrast  Result Date: 04/02/2021 CLINICAL DATA:  Cervicalgia EXAM: CT CERVICAL SPINE WITHOUT CONTRAST TECHNIQUE: Multidetector CT imaging of the cervical spine was performed without intravenous contrast. Multiplanar CT image reconstructions were also generated. COMPARISON:  None. FINDINGS: Alignment: There is slight upper thoracic levoscoliosis. No evidence spondylolisthesis. Skull base and vertebrae: Skull base and craniocervical junction regions appear  normal. No evident fracture. No blastic or lytic bone lesions. Soft tissues and spinal canal: Prevertebral soft tissues and predental space regions are normal. No cord canal hematoma. No paraspinous lesions. Disc levels:  Disc spaces appear normal. At C2-3, there is no nerve root edema or effacement. No appreciable facet hypertrophy. No disc extrusion or stenosis. At C3-4, there is no appreciable facet hypertrophy. No nerve root edema or effacement. No disc extrusion or stenosis. At C4-5, there is no appreciable facet hypertrophy. No nerve root edema or effacement. No disc extrusion or stenosis. At C5-6, there is no appreciable facet hypertrophy. No nerve root edema or effacement. No disc extrusion or stenosis. At C6-7, no appreciable facet hypertrophy. No nerve root edema or effacement. No disc extrusion or stenosis. At  C7-T1, no appreciable facet hypertrophy. No nerve root edema or effacement. No disc extrusion or stenosis. Upper chest: Visualized upper lung regions are clear. Other: None IMPRESSION: No nerve root edema or effacement. No disc extrusion or stenosis. No appreciable arthropathic change. No fracture or spondylolisthesis. There is slight upper thoracic levoscoliosis. Electronically Signed   By: Bretta Bang III M.D.   On: 04/02/2021 13:12    Procedures Procedures   Medications Ordered in ED Medications  ketorolac (TORADOL) injection 60 mg (has no administration in time range)    ED Course  I have reviewed the triage vital signs and the nursing notes.  Pertinent labs & imaging results that were available during my care of the patient were reviewed by me and considered in my medical decision making (see chart for details).    MDM Rules/Calculators/A&P                          30 year old female presents to the ER with neck pain.  On arrival, she is well-appearing, no acute distress, resting comfortably in the ER bed.  Physical exam with point tenderness to the base of the left  side of her skull, with associated paraspinal muscle tenderness.  Slightly decreased range of motion secondary to pain.  Neuro exam intact.  Equal strength in upper and lower extremities bilaterally.  Patient denies any neck manipulation by the chiropractor.  Lower suspicion for dissection at this time.  CT imaging ordered, reviewed and interpreted by me.   CT findings with no nerve root edema, no disc protrusion, no arthropathic changes.  No fractures or spondylolisthesis.  There is some slight upper thoracic levoscoliosis.  Given reassuring CT scan, suspect cervical neck strain.  Given head pain, could be possible subdural neuralgia as well.  Low suspicion for abscess.  Patient given Toradol here in the ER, encouraged her to continue to take ibuprofen, up to 800 mg 3 times daily for a week.  We will also send home with muscle relaxer Medrol Dosepak.  Encouraged stretching, heat.  Encouraged following up with orthopedics or chiropractor if symptoms continue.  Return precautions discussed.  She was understanding and agreeable.  Stable for discharge.  Case discussed with Dr. Rubin Payor who is agreeable to the above plan disposition.   Final Clinical Impression(s) / ED Diagnoses Final diagnoses:  Strain of neck muscle, initial encounter    Rx / DC Orders ED Discharge Orders         Ordered    methocarbamol (ROBAXIN) 500 MG tablet  2 times daily        04/02/21 1322    methylPREDNISolone (MEDROL DOSEPAK) 4 MG TBPK tablet        04/02/21 1322           Leone Brand 04/02/21 1324    Benjiman Core, MD 04/02/21 1607

## 2021-04-02 NOTE — ED Triage Notes (Signed)
Pt states she is here today due to neck pain radiating into her head.Pt reported she was seeing her chiropractor and they her because the pain is radiating to her neck she needs to have a CT scan.Neuro intact in triage.

## 2021-04-02 NOTE — Discharge Instructions (Signed)
Your CT scan findings were reassuring.  Please continue to take ibuprofen, you may take up to 800 mg up to 3 times daily for a week.  You may also take the muscle relaxer, take at night and do not drink or drive the medication as it can make you sleepy.  I was prescribed a short course of steroids to help with inflammation.  Please also use the neck stretches apply heat as needed.  Please return to follow-up with your primary care doctor if your symptoms continue.  Return to the ER for any new or worsening symptoms.

## 2021-04-09 ENCOUNTER — Inpatient Hospital Stay (HOSPITAL_COMMUNITY)
Admission: EM | Admit: 2021-04-09 | Discharge: 2021-04-11 | DRG: 064 | Disposition: A | Discharge status: Home / Self Care | Payer: 59 | Attending: Internal Medicine | Admitting: Internal Medicine

## 2021-04-09 ENCOUNTER — Emergency Department (HOSPITAL_COMMUNITY): Payer: 59

## 2021-04-09 ENCOUNTER — Encounter (HOSPITAL_COMMUNITY): Payer: Self-pay | Admitting: Pharmacy Technician

## 2021-04-09 DIAGNOSIS — E785 Hyperlipidemia, unspecified: Secondary | ICD-10-CM | POA: Diagnosis present

## 2021-04-09 DIAGNOSIS — Z79899 Other long term (current) drug therapy: Secondary | ICD-10-CM

## 2021-04-09 DIAGNOSIS — I6389 Other cerebral infarction: Secondary | ICD-10-CM | POA: Diagnosis not present

## 2021-04-09 DIAGNOSIS — R29700 NIHSS score 0: Secondary | ICD-10-CM | POA: Diagnosis present

## 2021-04-09 DIAGNOSIS — F17200 Nicotine dependence, unspecified, uncomplicated: Secondary | ICD-10-CM | POA: Diagnosis present

## 2021-04-09 DIAGNOSIS — Z20822 Contact with and (suspected) exposure to covid-19: Secondary | ICD-10-CM | POA: Diagnosis present

## 2021-04-09 DIAGNOSIS — D72829 Elevated white blood cell count, unspecified: Secondary | ICD-10-CM | POA: Diagnosis present

## 2021-04-09 DIAGNOSIS — I7774 Dissection of vertebral artery: Secondary | ICD-10-CM | POA: Diagnosis present

## 2021-04-09 DIAGNOSIS — Z791 Long term (current) use of non-steroidal anti-inflammatories (NSAID): Secondary | ICD-10-CM

## 2021-04-09 DIAGNOSIS — Z880 Allergy status to penicillin: Secondary | ICD-10-CM | POA: Diagnosis not present

## 2021-04-09 DIAGNOSIS — R030 Elevated blood-pressure reading, without diagnosis of hypertension: Secondary | ICD-10-CM | POA: Diagnosis present

## 2021-04-09 DIAGNOSIS — I63542 Cerebral infarction due to unspecified occlusion or stenosis of left cerebellar artery: Secondary | ICD-10-CM | POA: Diagnosis present

## 2021-04-09 DIAGNOSIS — Z881 Allergy status to other antibiotic agents status: Secondary | ICD-10-CM | POA: Diagnosis not present

## 2021-04-09 DIAGNOSIS — I639 Cerebral infarction, unspecified: Secondary | ICD-10-CM

## 2021-04-09 LAB — URINALYSIS, ROUTINE W REFLEX MICROSCOPIC
Bilirubin Urine: NEGATIVE
Glucose, UA: NEGATIVE mg/dL
Hgb urine dipstick: NEGATIVE
Ketones, ur: 20 mg/dL — AB
Leukocytes,Ua: NEGATIVE
Nitrite: NEGATIVE
Protein, ur: NEGATIVE mg/dL
Specific Gravity, Urine: 1.03 (ref 1.005–1.030)
pH: 7 (ref 5.0–8.0)

## 2021-04-09 LAB — CBC WITH DIFFERENTIAL/PLATELET
Abs Immature Granulocytes: 0.06 10*3/uL (ref 0.00–0.07)
Basophils Absolute: 0.1 10*3/uL (ref 0.0–0.1)
Basophils Relative: 1 %
Eosinophils Absolute: 0.1 10*3/uL (ref 0.0–0.5)
Eosinophils Relative: 1 %
HCT: 44.6 % (ref 36.0–46.0)
Hemoglobin: 14.3 g/dL (ref 12.0–15.0)
Immature Granulocytes: 1 %
Lymphocytes Relative: 28 %
Lymphs Abs: 3.7 10*3/uL (ref 0.7–4.0)
MCH: 31 pg (ref 26.0–34.0)
MCHC: 32.1 g/dL (ref 30.0–36.0)
MCV: 96.5 fL (ref 80.0–100.0)
Monocytes Absolute: 0.8 10*3/uL (ref 0.1–1.0)
Monocytes Relative: 6 %
Neutro Abs: 8.5 10*3/uL — ABNORMAL HIGH (ref 1.7–7.7)
Neutrophils Relative %: 63 %
Platelets: 449 10*3/uL — ABNORMAL HIGH (ref 150–400)
RBC: 4.62 MIL/uL (ref 3.87–5.11)
RDW: 12.6 % (ref 11.5–15.5)
WBC: 13.2 10*3/uL — ABNORMAL HIGH (ref 4.0–10.5)
nRBC: 0 % (ref 0.0–0.2)

## 2021-04-09 LAB — I-STAT BETA HCG BLOOD, ED (MC, WL, AP ONLY): I-stat hCG, quantitative: <5 m[IU]/mL (ref <5)

## 2021-04-09 LAB — COMPREHENSIVE METABOLIC PANEL
ALT: 14 U/L (ref 0–44)
AST: 14 U/L — ABNORMAL LOW (ref 15–41)
Albumin: 4.4 g/dL (ref 3.5–5.0)
Alkaline Phosphatase: 51 U/L (ref 38–126)
Anion gap: 11 (ref 5–15)
BUN: 11 mg/dL (ref 6–20)
CO2: 24 mmol/L (ref 22–32)
Calcium: 9.7 mg/dL (ref 8.9–10.3)
Chloride: 103 mmol/L (ref 98–111)
Creatinine, Ser: 0.88 mg/dL (ref 0.44–1.00)
GFR, Estimated: >60 mL/min (ref >60)
Glucose, Bld: 87 mg/dL (ref 70–99)
Potassium: 3.7 mmol/L (ref 3.5–5.1)
Sodium: 138 mmol/L (ref 135–145)
Total Bilirubin: 0.7 mg/dL (ref 0.3–1.2)
Total Protein: 7.5 g/dL (ref 6.5–8.1)

## 2021-04-09 LAB — PREGNANCY, URINE: Preg Test, Ur: NEGATIVE

## 2021-04-09 LAB — SEDIMENTATION RATE: Sed Rate: 4 mm/hr (ref 0–22)

## 2021-04-09 LAB — HIV ANTIBODY (ROUTINE TESTING W REFLEX): HIV Screen 4th Generation wRfx: NONREACTIVE

## 2021-04-09 LAB — C-REACTIVE PROTEIN: CRP: <0.5 mg/dL (ref <1.0)

## 2021-04-09 IMAGING — MR MR CERVICAL SPINE W/O CM
4 of 5 series · 19 of 48 positions shown · non-contrast
Comparison: Brain MRI same day

CLINICAL DATA: Neck pain on the left. Left PICA infarction by brain
MRI.

EXAM:
MRI CERVICAL SPINE WITHOUT CONTRAST
TECHNIQUE: Multiplanar, multisequence MR imaging of the cervical spine was
performed. No intravenous contrast was administered.

[Series 12: T2 · sagittal · 3.0mm · 0.43mm/px · 6 of 14 slices shown (1 of 2)]
[im 1/14]
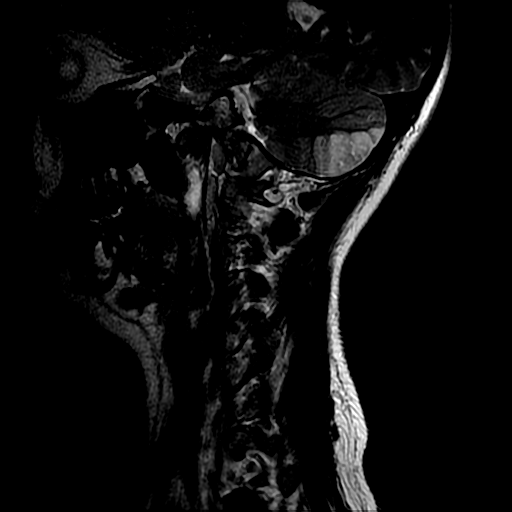
[im 3/14]
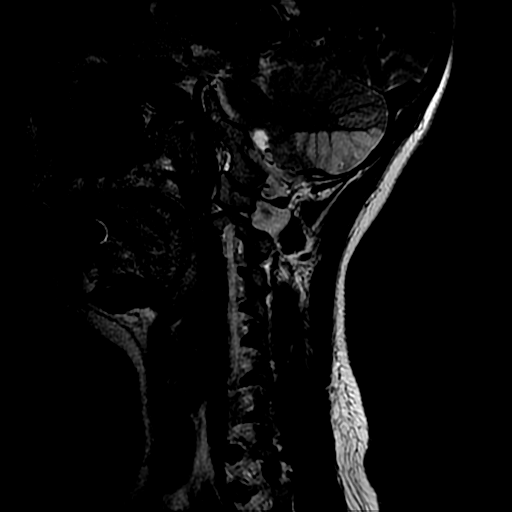
[im 6/14]
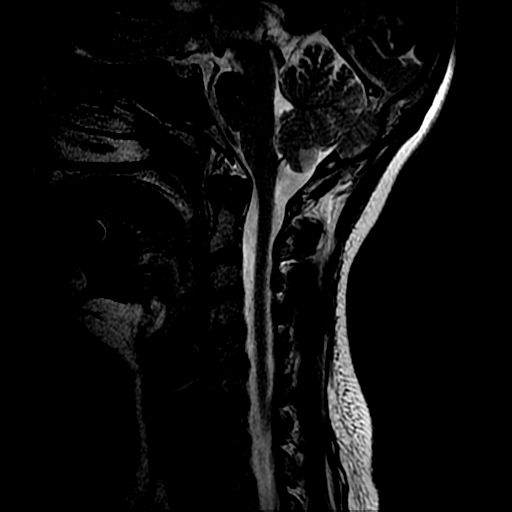
[im 8/14]
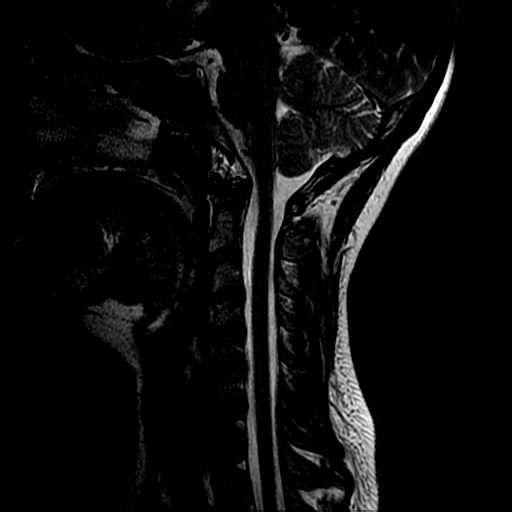
[im 11/14]
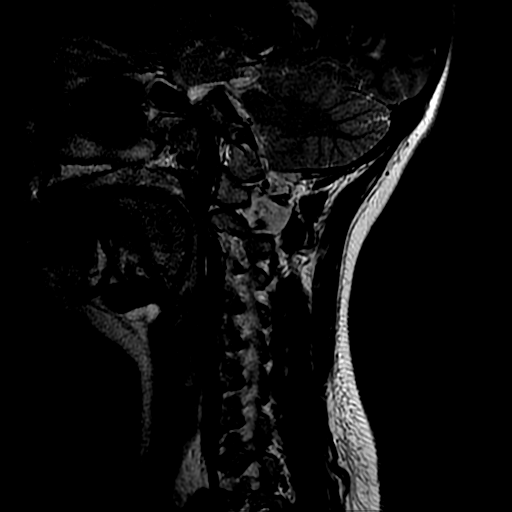
[im 14/14]
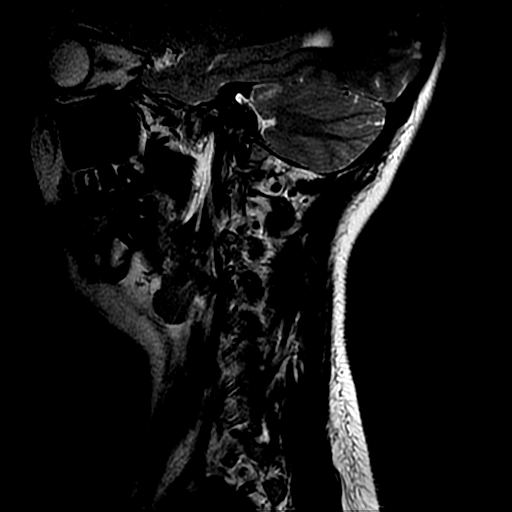

[Series 13: sag ir · sagittal · 3.0mm · 0.43mm/px · 3 of 14 slices shown]
[im 3/14]
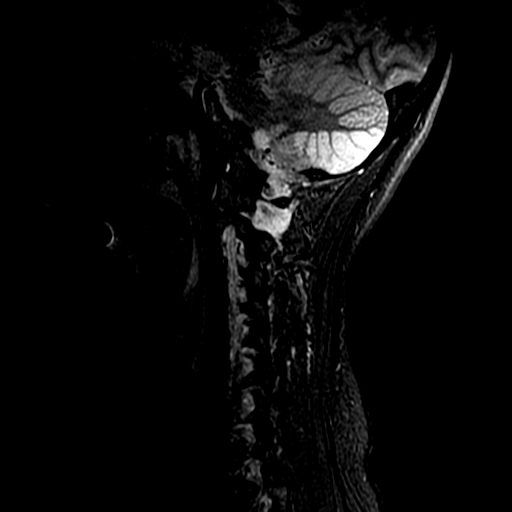
[im 7/14]
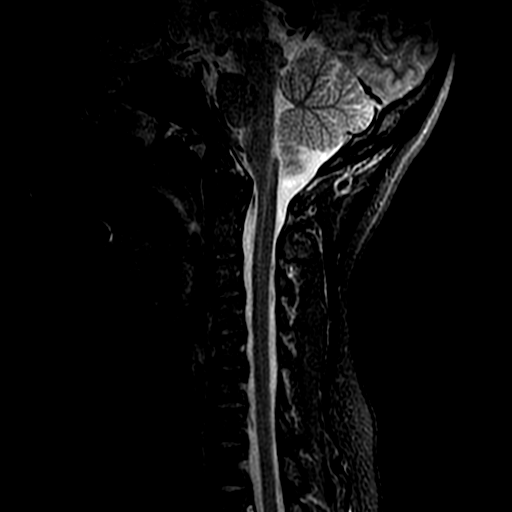
[im 11/14]
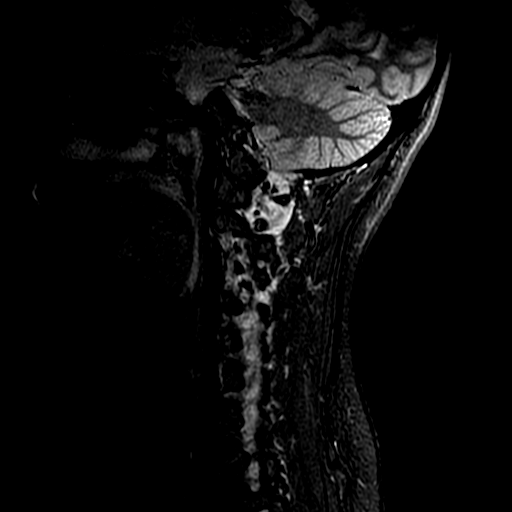

[Series 14: T1 · sagittal · 3.0mm · 0.43mm/px · 3 of 14 slices shown]
[im 3/14]
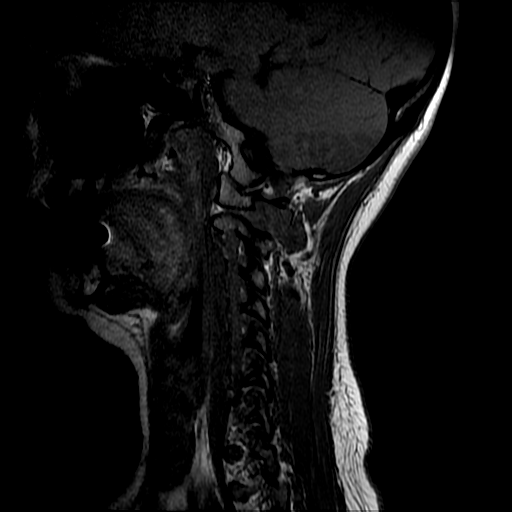
[im 7/14]
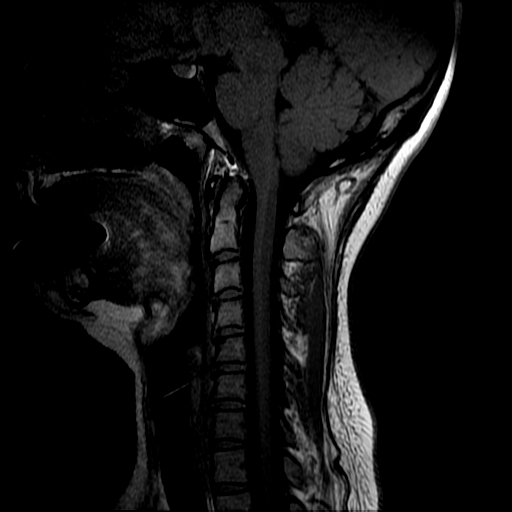
[im 11/14]
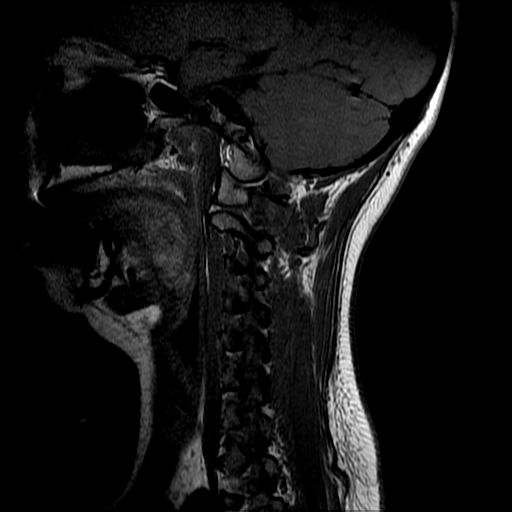

[Series 16: T2 · axial · 3.0mm · 0.39mm/px · z∈[-187,-115]mm · 7 of 28 slices shown (2 of 2)]
[im 1/28]
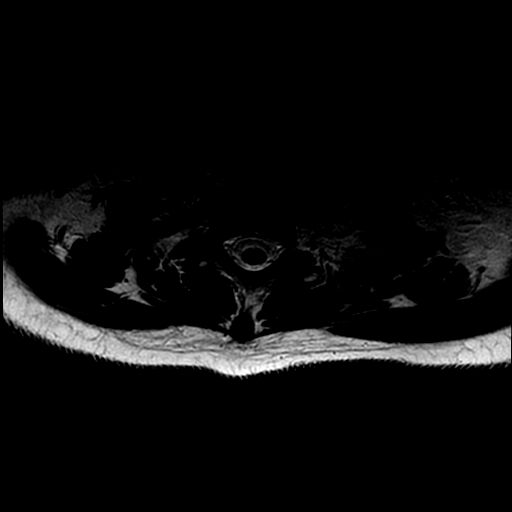
[im 5/28]
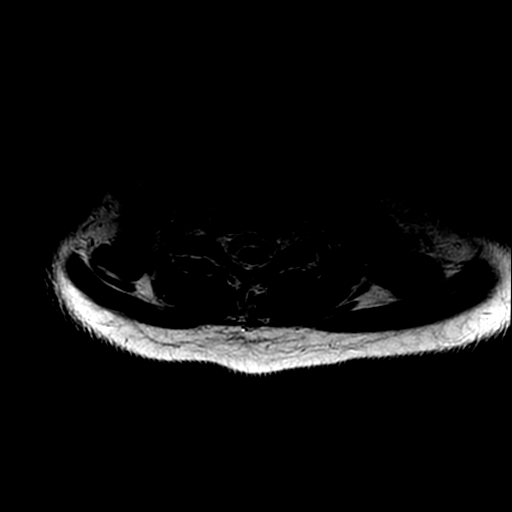
[im 9/28]
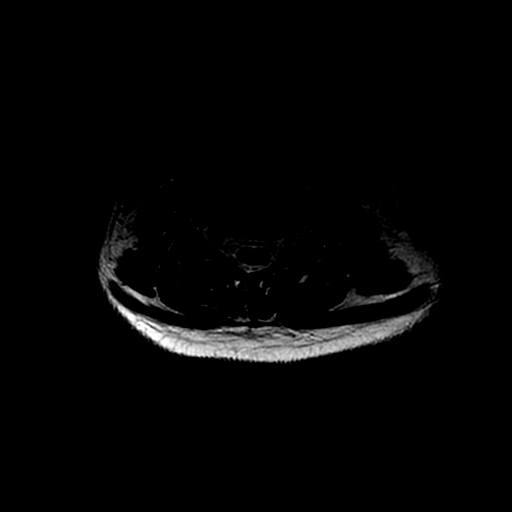
[im 13/28]
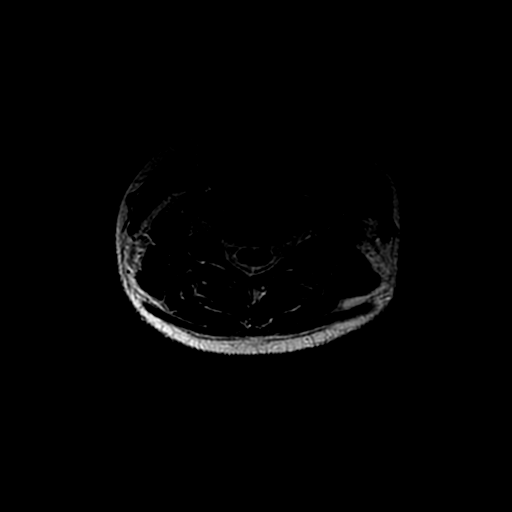
[im 15/28]
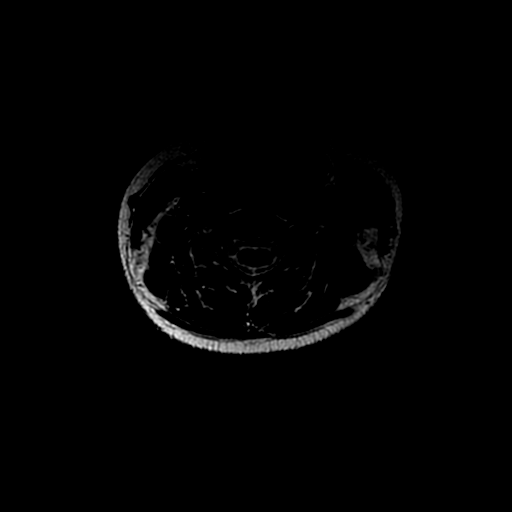
[im 19/28]
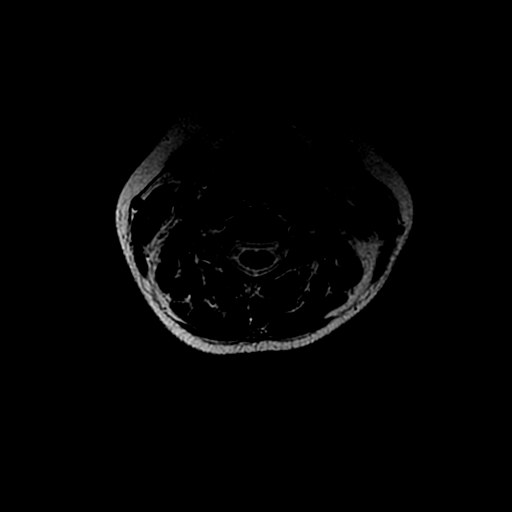
[im 23/28]
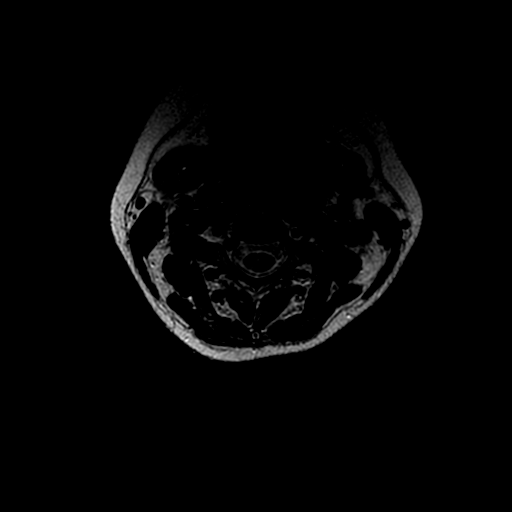

[19 of 48 positions shown; findings below may reference images not displayed]

FINDINGS: Alignment: Normal

Vertebrae: Normal

Cord: Normal

Posterior Fossa, vertebral arteries, paraspinal tissues: Acute
infarction inferior cerebellum on the left. Question abnormal flow
within the proximal left vertebral artery. Flow does appear to be
present in the more distal vessel. Vascular evaluation suggested for
possibility left vertebral dissection.

Disc levels:

No significant disc finding. Minimal non-compressive disc bulges
C5-6 and C6-7. No facet arthropathy.
IMPRESSION: Acute infarction of the inferior cerebellum on the left. Question
abnormal flow in the proximal left vertebral artery. Vascular
evaluation suggested for possibility of left vertebral dissection.

## 2021-04-09 IMAGING — CT CT ANGIO HEAD
1 of 11 series · 5 of 33 positions shown · IV contrast (omnipaque)
Comparison: Brain MRI [DATE].  Cervical spine MRI [DATE].

CLINICAL DATA: Dizziness, nonspecific; dissection on MRI. Follow-up
CTA.

EXAM:
CT ANGIOGRAPHY HEAD AND NECK
TECHNIQUE: Multidetector CT imaging of the head and neck was performed using
the standard protocol during bolus administration of intravenous
contrast. Multiplanar CT image reconstructions and MIPs were
obtained to evaluate the vascular anatomy. Carotid stenosis
measurements (when applicable) are obtained utilizing NASCET
criteria, using the distal internal carotid diameter as the
denominator.
CONTRAST:  75mL OMNIPAQUE IOHEXOL 350 MG/ML SOLN

[Series 10: ax thins · axial · 0.39mm/px · z∈[-236,-12]mm · 5 of 335 slices shown]
[im 56/335  soft-tissue]
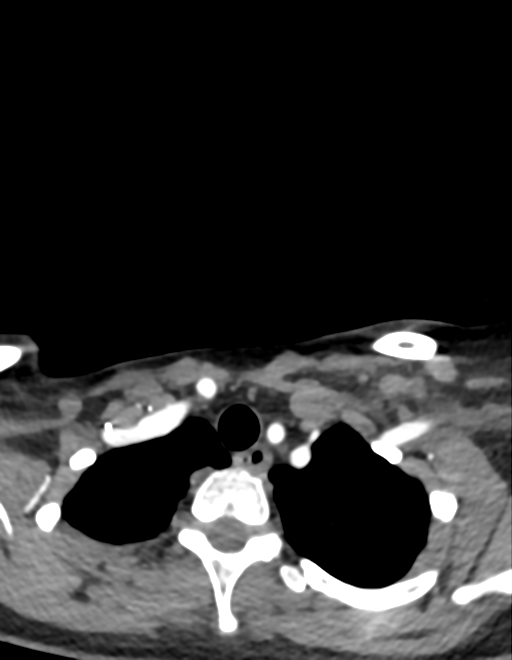
[im 112/335  bone]
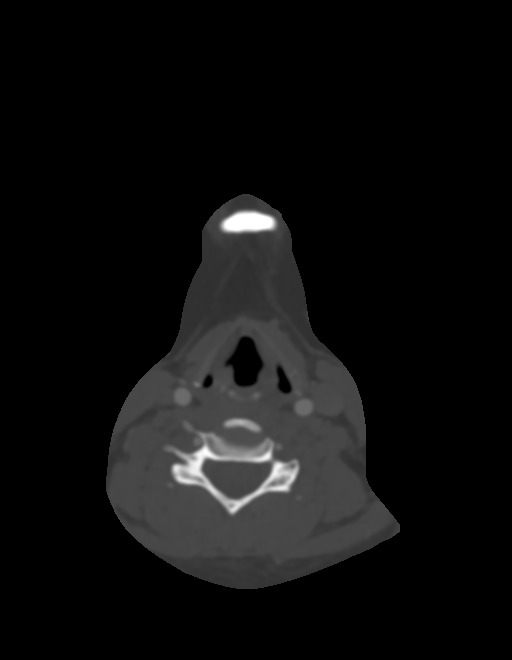
[im 168/335  soft-tissue]
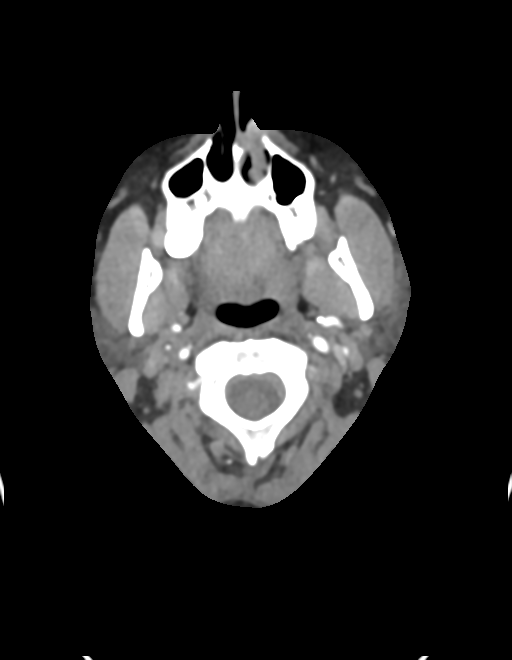
[im 223/335  bone]
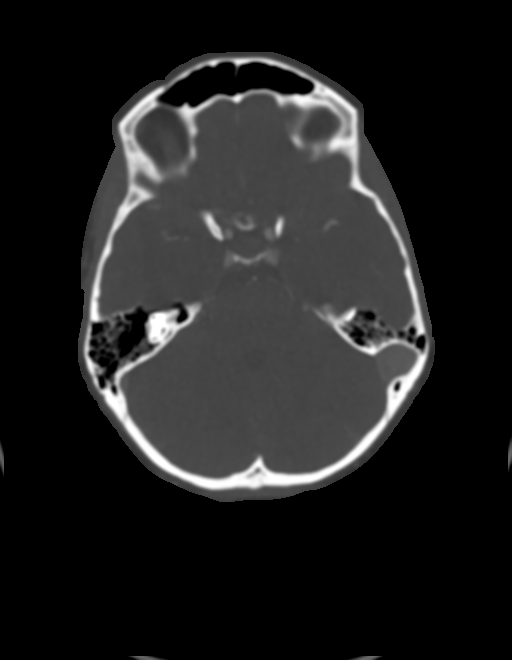
[im 279/335  soft-tissue]
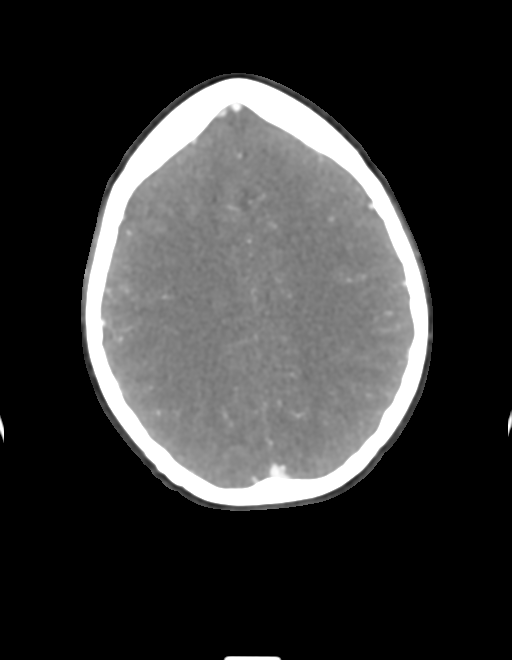

[5 of 33 positions shown; findings below may reference images not displayed]

FINDINGS: CT HEAD FINDINGS

Brain:

Cerebral volume is normal.

Redemonstrated acute infarction within the inferior left cerebellum
(PICA vascular territory), measuring 4.5 x 2.3 cm in transaxial
dimensions (for instance as seen on series 5, image 7). Unchanged
mild petechial hemorrhage within the infarction territory. No
significant mass effect or fourth ventricular effacement at this
time.

There is no acute intracranial hemorrhage.

No demarcated cortical infarct.

No extra-axial fluid collection.

No evidence of intracranial mass.

No midline shift.

Vascular: Reported below.

Skull: Normal. Negative for fracture or focal lesion.

Sinuses: No significant paranasal sinus disease.

Orbits: No mass or acute finding.

Review of the MIP images confirms the above findings

CTA NECK FINDINGS

Aortic arch: Standard aortic branching. The visualized aortic arch
is normal in caliber. No hemodynamically significant innominate or
proximal subclavian artery stenosis.

Right carotid system: CCA and ICA patent within the neck without
stenosis. No significant atherosclerotic disease. Minimal
irregularity of the proximal to mid cervical ICA (for instance as
seen on series 13, image 73).

Left carotid system: CCA and ICA patent within the neck without
stenosis. No significant atherosclerotic disease.

Vertebral arteries: The right vertebral artery is slightly dominant.
The right vertebral artery is patent. However, there is a diminutive
and slightly irregular appearance of the right vertebral artery at
the C1-C2 level (for instance as seen on series 10, image 153). Left
vertebral artery is patent and unremarkable in appearance to the C2
level. However, the C1-C2 level, the left vertebral artery is
markedly irregular and diminutive with sites of high-grade near
occlusive stenosis. V3 and V4 left vertebral artery is patent.
However, the proximal V4 left vertebral artery is also irregular and
markedly diminutive with additional sites of high-grade stenosis.

Skeleton: No acute bony abnormality or aggressive osseous lesion.
Nonspecific reversal of the expected cervical lordosis.

Other neck: No neck mass or cervical lymphadenopathy. Thyroid
unremarkable.

Upper chest: No consolidation within the imaged lung apices.

Review of the MIP images confirms the above findings

CTA HEAD FINDINGS

Anterior circulation:

The intracranial internal carotid arteries are patent. The M1 middle
cerebral arteries are patent. No M2 proximal branch occlusion or
high-grade proximal stenosis is identified. The intracranial
internal carotid arteries are patent.

Posterior circulation:

The intracranial right vertebral artery is patent without
significant stenosis. As described above, the proximal V4 left
vertebral artery is irregular and markedly diminutive with sites of
high-grade stenosis. Enhancement is seen within the proximal left
PICA. The basilar artery is patent. The basilar artery is
developmentally diminutive. The posterior cerebral arteries are
patent. The P1 segments are hypoplastic bilaterally and sizable
bilateral posterior communicating arteries are present.

There is a slightly bulbous appearance of the basilar tip, measuring
3 mm in transverse dimension (versus 1-2 mm more proximally

There is a slightly bulbous appearance of the basilar tip, measuring
3 mm in transverse dimensions (versus 1-2 mm) (series 13, image
100).

Venous sinuses: Within the limitations of contrast timing, no
convincing thrombus.

Anatomic variants: As described

Review of the MIP images confirms the above findings

Emergent results were called by telephone at the time of
interpretation on [DATE] at [DATE] to provider PA T-FLOW, who
verbally acknowledged these results.
IMPRESSION: CT head:

Redemonstrated acute infarction within the inferior left cerebellum,
measuring 4.5 x 2.3 cm (PICA territory). Unchanged mild associated
petechial hemorrhage. No significant mass effect or fourth
ventricular effacement at this time.

CTA head & neck:

1. The left vertebral artery is markedly irregular and diminutive
with sites of high-grade, near-occlusive stenosis at the C1-C2
level. The left vertebral artery is patent at the V3 and V4 levels.
However, the proximal V4 segment is also irregular and are markedly
diminutive with sites of high-grade stenosis. Findings are highly
suspicious for vertebral artery dissection. Enhancement is present
within the proximal left PICA.
2. In addition to the left vertebral artery findings described
above. The right vertebral artery is diminutive and slightly
irregular in appearance at the C1-C2 level. Additionally subtle
irregularity of the proximal-to-mid cervical right ICA. These
findings raise suspicion for an underlying vasculopathy (such as
fibromuscular dysplasia) or large vessel vasculitis.
3. Slightly bulbous appearance of the basilar artery tip, measuring
3 mm in transverse dimension. A small basilar tip aneurysm cannot be
excluded, and CTA or MRA follow-up should be considered.

## 2021-04-09 IMAGING — MR MR HEAD W/O CM
9 of 10 series · 38 of 48 positions shown · non-contrast
Comparison: Cervical spine CT 1 week ago.

CLINICAL DATA: Chronic headache with increasing frequency. Pain
worse on the left. Dizziness and vomiting.

EXAM:
MRI HEAD WITHOUT CONTRAST
TECHNIQUE: Multiplanar, multiecho pulse sequences of the brain and surrounding
structures were obtained without intravenous contrast.

[Series 3: DWI · axial · 3.0mm · 1.09mm/px · z∈[-67,+87]mm · 11 of 106 slices shown (1 of 4)]
[im 1/106]
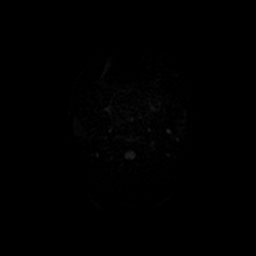
[im 11/106]
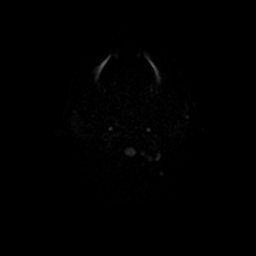
[im 22/106]
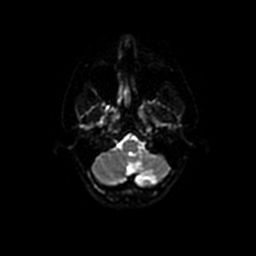
[im 32/106]
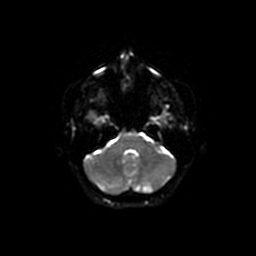
[im 43/106]
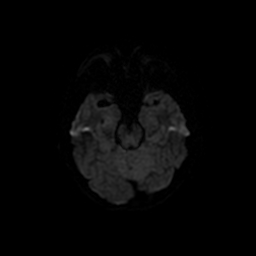
[im 53/106]
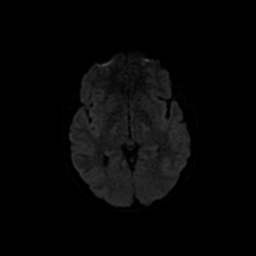
[im 64/106]
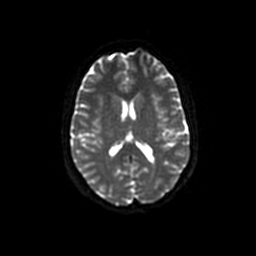
[im 74/106]
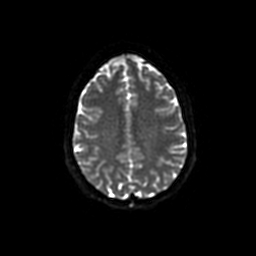
[im 85/106]
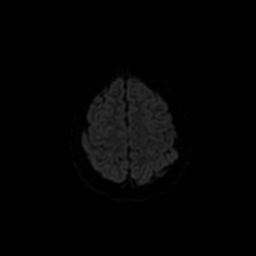
[im 95/106]
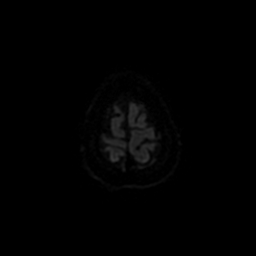
[im 106/106]
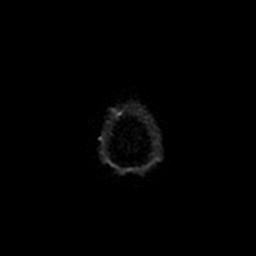

[Series 4: DWI · coronal · 5.0mm · 1.09mm/px · 8 of 80 slices shown (2 of 4)]
[im 1/80]
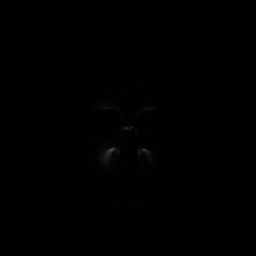
[im 12/80]
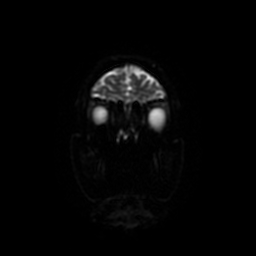
[im 23/80]
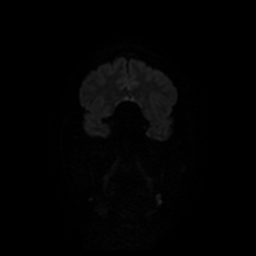
[im 34/80]
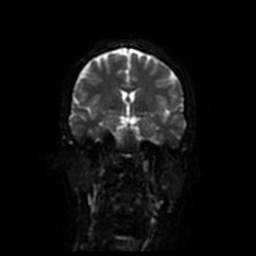
[im 46/80]
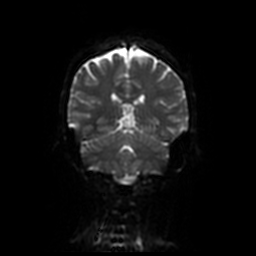
[im 57/80]
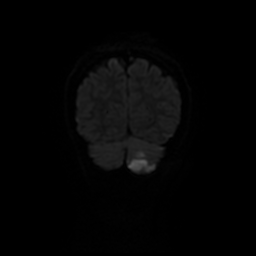
[im 68/80]
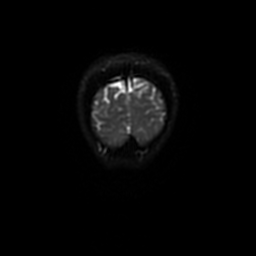
[im 80/80]
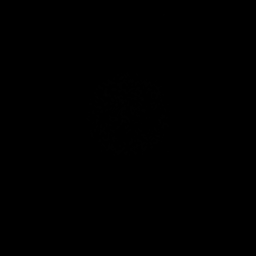

[Series 5: T1 · sagittal · 5.0mm · 0.47mm/px · 2 of 23 slices shown (1 of 2)]
[im 1/23]
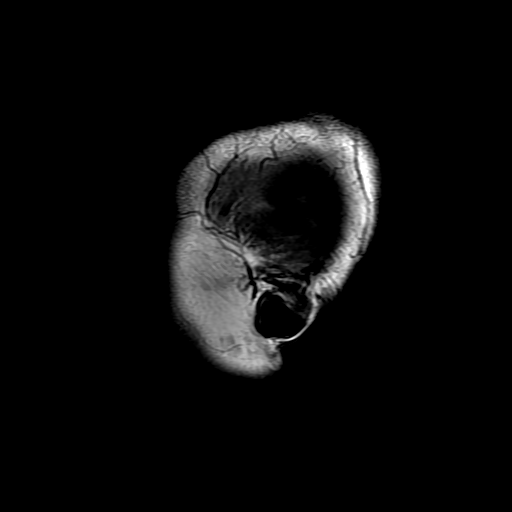
[im 23/23]
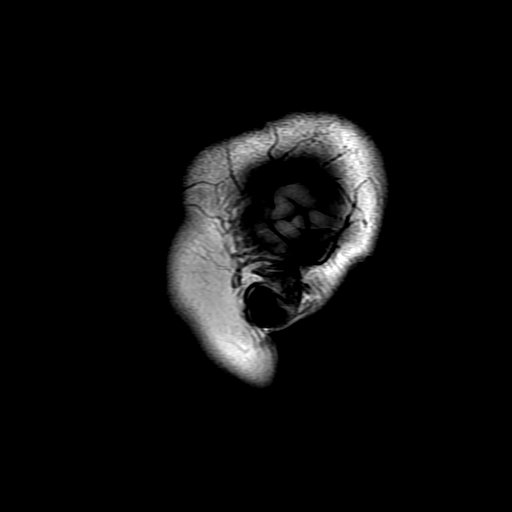

[Series 6: T2 · axial · 5.0mm · 0.43mm/px · z∈[-65,+77]mm · 2 of 25 slices shown (1 of 2)]
[im 1/25]
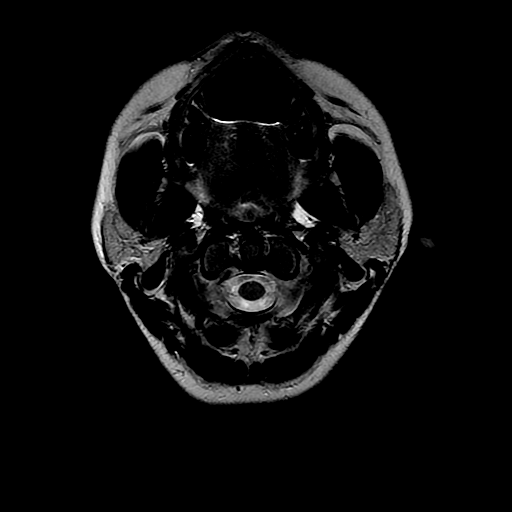
[im 25/25]
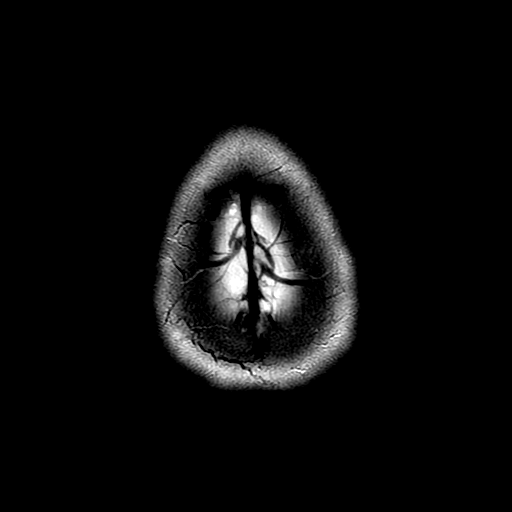

[Series 7: FLAIR · axial · 3.0mm · 0.43mm/px · z∈[-65,+77]mm · 2 of 25 slices shown]
[im 1/25]
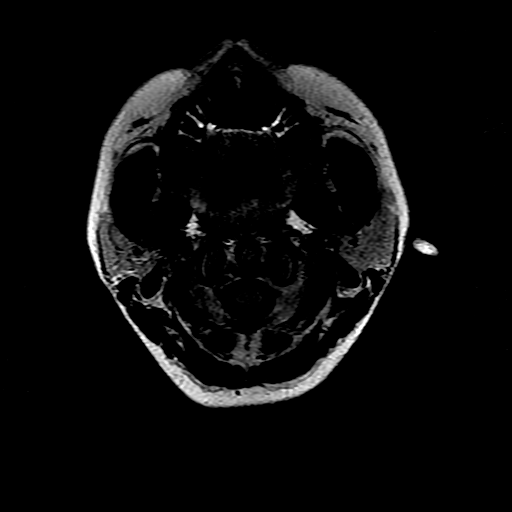
[im 25/25]
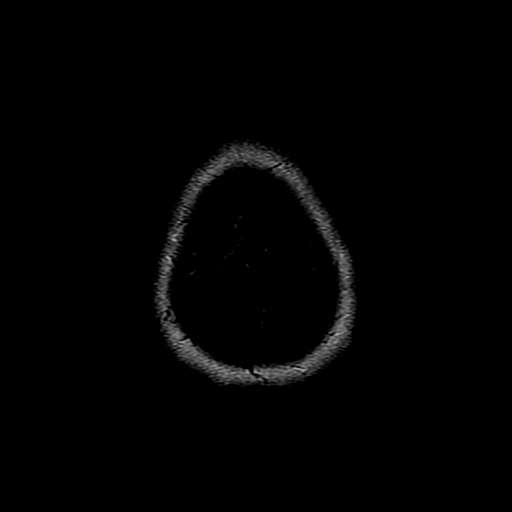

[Series 10: T1 · axial · 3.0mm · 0.47mm/px · z∈[-68,-51]mm · 2 of 100 slices shown (2 of 2)]
[im 1/100]
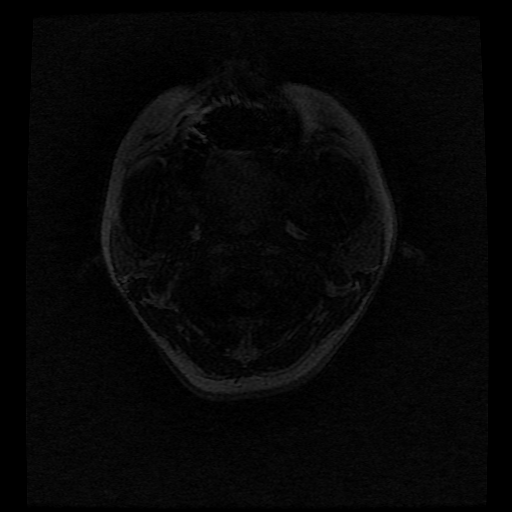
[im 12/100]
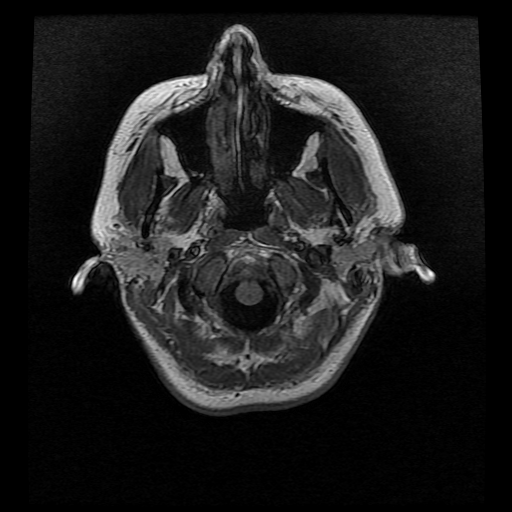

[Series 11: T2 · coronal · 5.0mm · 0.39mm/px · 2 of 24 slices shown (2 of 2)]
[im 1/24]
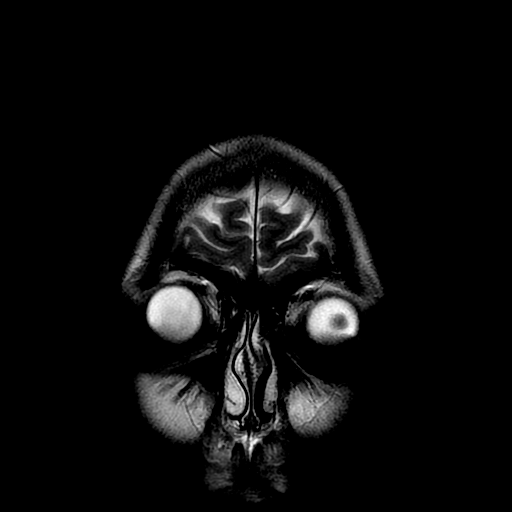
[im 24/24]
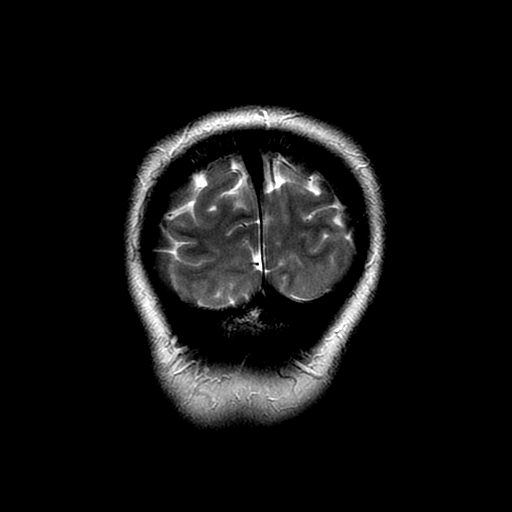

[Series 300: DWI · axial · 3.0mm · 1.09mm/px · z∈[-67,+87]mm · 5 of 53 slices shown (3 of 4)]
[im 1/53]
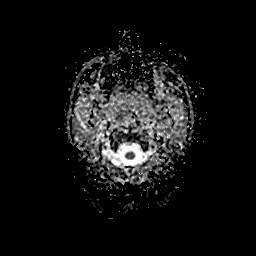
[im 14/53]
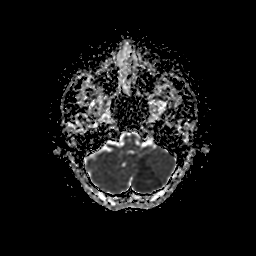
[im 27/53]
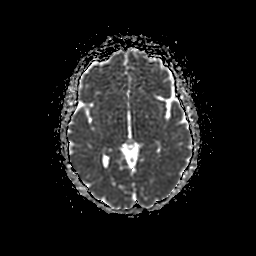
[im 40/53]
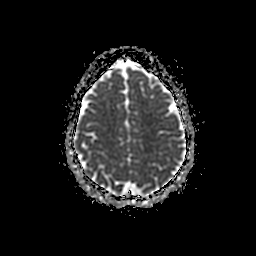
[im 53/53]
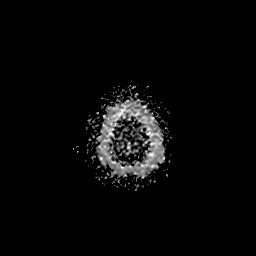

[Series 400: DWI · coronal · 5.0mm · 1.09mm/px · 4 of 37 slices shown (4 of 4)]
[im 1/37]
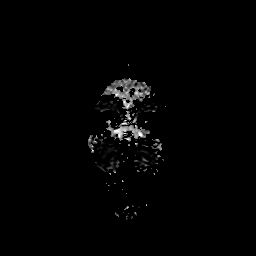
[im 13/37]
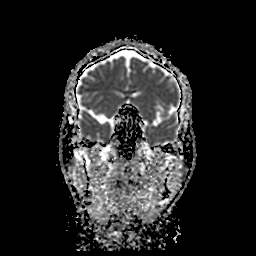
[im 25/37]
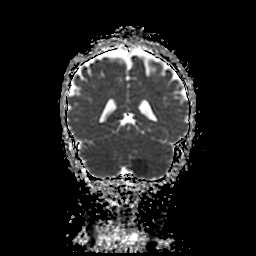
[im 37/37]
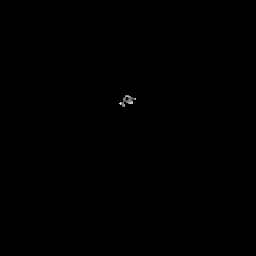

[38 of 48 positions shown; findings below may reference images not displayed]

FINDINGS: Brain: 3-4 cm acute infarction within the inferior cerebellum on the
left consistent with PICA distribution infarction. No evidence
medullary involvement. Low level blood products within the region of
infarction without evidence of frank hematoma. Otherwise, brain has
normal appearance without evidence of malformation, atrophy, other
old or acute infarction mass lesion, hydrocephalus or extra-axial
collection.

Vascular: Question abnormal flow in the left vertebral artery.

Skull and upper cervical spine: Negative

Sinuses/Orbits: Clear/normal

Other: None
IMPRESSION: 1. 3-4 cm acute infarction in the inferior cerebellum on the left
consistent with PICA distribution infarction. Low level blood
products within the region of infarction without frank hematoma.
2. Question abnormal flow in the left vertebral artery. Recommend
vascular evaluation, with the possibility of left vertebral
dissection.

## 2021-04-09 MED ORDER — ASPIRIN 81 MG PO CHEW
81.0000 mg | CHEWABLE_TABLET | Freq: Every day | ORAL | Status: DC
  Filled 2021-04-09: qty 1

## 2021-04-09 MED ORDER — SENNOSIDES-DOCUSATE SODIUM 8.6-50 MG PO TABS: 1.0000 | ORAL_TABLET | Freq: Every evening | ORAL | Status: DC | PRN

## 2021-04-09 MED ORDER — METHOCARBAMOL 500 MG PO TABS
500.0000 mg | ORAL_TABLET | Freq: Every day | ORAL | Status: DC
  Administered 2021-04-09 – 2021-04-10 (×2): 500 mg via ORAL
  Filled 2021-04-09 (×2): qty 1

## 2021-04-09 MED ORDER — METOCLOPRAMIDE HCL 5 MG/ML IJ SOLN
10.0000 mg | Freq: Four times a day (QID) | INTRAMUSCULAR | Status: DC | PRN
  Administered 2021-04-09 – 2021-04-10 (×2): 10 mg via INTRAVENOUS
  Filled 2021-04-09 (×2): qty 2

## 2021-04-09 MED ORDER — SODIUM CHLORIDE 0.9 % IV BOLUS
1000.0000 mL | Freq: Once | INTRAVENOUS | Status: AC
  Administered 2021-04-09: 1000 mL via INTRAVENOUS

## 2021-04-09 MED ORDER — ACETAMINOPHEN 650 MG RE SUPP: 650.0000 mg | RECTAL | Status: DC | PRN

## 2021-04-09 MED ORDER — MORPHINE SULFATE (PF) 4 MG/ML IV SOLN
4.0000 mg | Freq: Once | INTRAVENOUS | Status: AC
  Administered 2021-04-09: 4 mg via INTRAVENOUS
  Filled 2021-04-09: qty 1

## 2021-04-09 MED ORDER — CLOPIDOGREL BISULFATE 300 MG PO TABS
300.0000 mg | ORAL_TABLET | Freq: Once | ORAL | Status: AC
  Administered 2021-04-09: 300 mg via ORAL
  Filled 2021-04-09: qty 1

## 2021-04-09 MED ORDER — ACETAMINOPHEN 325 MG PO TABS
650.0000 mg | ORAL_TABLET | ORAL | Status: DC | PRN
  Administered 2021-04-10 – 2021-04-11 (×3): 650 mg via ORAL
  Filled 2021-04-09 (×3): qty 2

## 2021-04-09 MED ORDER — STROKE: EARLY STAGES OF RECOVERY BOOK
Freq: Once | Status: AC
  Administered 2021-04-10: 1
  Filled 2021-04-09: qty 1

## 2021-04-09 MED ORDER — CARVEDILOL 3.125 MG PO TABS
3.1250 mg | ORAL_TABLET | Freq: Two times a day (BID) | ORAL | Status: DC
  Administered 2021-04-10: 3.125 mg via ORAL
  Filled 2021-04-09: qty 1

## 2021-04-09 MED ORDER — ASPIRIN 325 MG PO TABS
325.0000 mg | ORAL_TABLET | Freq: Once | ORAL | Status: AC
  Administered 2021-04-09: 325 mg via ORAL
  Filled 2021-04-09: qty 1

## 2021-04-09 MED ORDER — ONDANSETRON HCL 4 MG/2ML IJ SOLN: 4.0000 mg | Freq: Four times a day (QID) | INTRAMUSCULAR | Status: DC | PRN

## 2021-04-09 MED ORDER — ACETAMINOPHEN 160 MG/5ML PO SOLN: 650.0000 mg | ORAL | Status: DC | PRN

## 2021-04-09 MED ORDER — DIPHENHYDRAMINE HCL 50 MG/ML IJ SOLN
25.0000 mg | Freq: Four times a day (QID) | INTRAMUSCULAR | Status: DC | PRN
  Administered 2021-04-09 – 2021-04-10 (×2): 25 mg via INTRAVENOUS
  Filled 2021-04-09 (×2): qty 1

## 2021-04-09 MED ORDER — HYDROMORPHONE HCL 1 MG/ML IJ SOLN
0.5000 mg | INTRAMUSCULAR | Status: DC | PRN
  Administered 2021-04-11: 0.5 mg via INTRAVENOUS
  Filled 2021-04-09: qty 0.5

## 2021-04-09 MED ORDER — CLOPIDOGREL BISULFATE 75 MG PO TABS
75.0000 mg | ORAL_TABLET | Freq: Every day | ORAL | Status: DC
  Administered 2021-04-10 – 2021-04-11 (×2): 75 mg via ORAL
  Filled 2021-04-09 (×2): qty 1

## 2021-04-09 MED ORDER — LORAZEPAM 1 MG PO TABS
0.5000 mg | ORAL_TABLET | Freq: Once | ORAL | Status: AC
  Administered 2021-04-09: 0.5 mg via ORAL
  Filled 2021-04-09: qty 1

## 2021-04-09 MED ORDER — MECLIZINE HCL 12.5 MG PO TABS
25.0000 mg | ORAL_TABLET | Freq: Three times a day (TID) | ORAL | Status: DC | PRN
  Administered 2021-04-10: 25 mg via ORAL
  Filled 2021-04-09: qty 2

## 2021-04-09 MED ORDER — MECLIZINE HCL 25 MG PO TABS
25.0000 mg | ORAL_TABLET | Freq: Once | ORAL | Status: AC
  Administered 2021-04-09: 25 mg via ORAL
  Filled 2021-04-09: qty 1

## 2021-04-09 MED ORDER — LABETALOL HCL 5 MG/ML IV SOLN: 10.0000 mg | INTRAVENOUS | Status: DC | PRN

## 2021-04-09 MED ORDER — SODIUM CHLORIDE 0.9 % IV SOLN: INTRAVENOUS | Status: AC

## 2021-04-09 MED ORDER — IOHEXOL 350 MG/ML SOLN
75.0000 mL | Freq: Once | INTRAVENOUS | Status: AC | PRN
  Administered 2021-04-09: 75 mL via INTRAVENOUS

## 2021-04-09 NOTE — H&P (Signed)
History and Physical    Caroline Graham DVV:[id number] DOB: 1991/06/05 DOA: 04/09/2021  PCP: Manon Hilding, MD (Confirm with patient/family/NH records and if not entered, this has to be entered at [facility] point of entry) Patient coming from: Home  I have personally briefly reviewed patient's old medical records in [place]  Chief Complaint: Feeling dizzy and neck pain  HPI: Caroline Graham is a 30 y.o. female with no significant medical history presents with worsening of left-sided headache, neck pain, nauseous and dizziness.  Patient was started to feel left sided neck pain and throbbing headache 2 weeks ago, on and off, has been taking OTC medication without significant improvement.  Denies any vision changes no hearing changes.  She went to see chiropractor 7 days ago who sent patient to ED.  CT cervical spine ED as no significant findings.  And patient was sent home with as needed steroid and other medications.  Despite, patient continued to experience frequent episodes of headaches and neck pains, and today she developed feeling of dizziness and unsteady gait and came to ED.  Patient denies any history of joint pain muscle pain let sensitivity, rash.  She is non-smoker and she does not take birth control pills.  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  CTA showed signs concerning for left vertebral artery dissection implying underlying fibromuscular dysplasia vs vasculitis.   Review of Systems: As per HPI otherwise 14 point review of systems negative.    History reviewed. No pertinent past medical history.  Past Surgical History:  Procedure Laterality Date  . KNEE SURGERY    . TONSILLECTOMY       reports that she has been smoking. She does not have any smokeless tobacco history on file. She reports current alcohol use. She reports that she does not use drugs.  Allergies  Allergen Reactions  . Ceclor [Cefaclor]   . Penicillins      No family history on file.   Prior to Admission medications   Medication Sig Start Date End Date Taking? Authorizing Provider  methocarbamol (ROBAXIN) 500 MG tablet Take 1 tablet (500 mg total) by mouth 2 (two) times daily. Patient taking differently: Take 500 mg by mouth at bedtime. 04/02/21  Yes Garald Balding, PA-C  meloxicam (MOBIC) 15 MG tablet Take 1 tablet (15 mg total) by mouth daily. Patient not taking: No sig reported 01/23/14   Liam Graham, PA-C  methylPREDNISolone (MEDROL DOSEPAK) 4 MG TBPK tablet Please take as directed until finished Patient not taking: No sig reported 04/02/21   Garald Balding, PA-C  traMADol (ULTRAM) 50 MG tablet Take 1 tablet (50 mg total) by mouth every 6 (six) hours as needed. Patient not taking: No sig reported 01/23/14   Liam Graham, PA-C    Physical Exam: Vitals:   04/09/21 1245 04/09/21 1300 04/09/21 1518 04/09/21 1742  BP: (!) 124/93 133/85 (!) 134/94 132/89  Pulse: 76 61 87 82  Resp: 19 (!) 22 17 17   Temp:   98 F (36.7 C)   TempSrc:   Oral   SpO2: 100% 99% 100% 100%    Constitutional: NAD, calm, comfortable Vitals:   04/09/21 1245 04/09/21 1300 04/09/21 1518 04/09/21 1742  BP: (!) 124/93 133/85 (!) 134/94 132/89  Pulse: 76 61 87 82  Resp: 19 (!) 22 17 17   Temp:   98 F (36.7 C)   TempSrc:   Oral   SpO2: 100% 99% 100% 100%  Eyes: PERRL, lids and conjunctivae normal ENMT: Mucous membranes are moist. Posterior pharynx clear of any exudate or lesions.Normal dentition.  Neck: normal, supple, no masses, no thyromegaly Respiratory: clear to auscultation bilaterally, no wheezing, no crackles. Normal respiratory effort. No accessory muscle use.  Cardiovascular: Regular rate and rhythm, no murmurs / rubs / gallops. No extremity edema. 2+ pedal pulses. No carotid bruits.  Abdomen: no tenderness, no masses palpated. No hepatosplenomegaly. Bowel sounds positive.  Musculoskeletal: no clubbing / cyanosis. No joint deformity upper  and lower extremities. Good ROM, no contractures. Normal muscle tone.  Skin: no rashes, lesions, ulcers. No induration Neurologic: CN 2-12 grossly intact. Sensation intact, DTR normal. Strength 5/5 in all 4.  Psychiatric: Normal judgment and insight. Alert and oriented x 3. Normal mood.     Labs on Admission: I have personally reviewed following labs and imaging studies  CBC: Recent Labs  Lab 04/09/21 1104  WBC 13.2*  NEUTROABS 8.5*  HGB 14.3  HCT 44.6  MCV 96.5  PLT 250*   Basic Metabolic Panel: Recent Labs  Lab 04/09/21 1104  NA 138  K 3.7  CL 103  CO2 24  GLUCOSE 87  BUN 11  CREATININE 0.88  CALCIUM 9.7   GFR: Estimated Creatinine Clearance: 77.3 mL/min (by C-G formula based on SCr of 0.88 mg/dL). Liver Function Tests: Recent Labs  Lab 04/09/21 1104  AST 14*  ALT 14  ALKPHOS 51  BILITOT 0.7  PROT 7.5  ALBUMIN 4.4   No results for input(s): LIPASE, AMYLASE in the last 168 hours. No results for input(s): AMMONIA in the last 168 hours. Coagulation Profile: No results for input(s): INR, PROTIME in the last 168 hours. Cardiac Enzymes: No results for input(s): CKTOTAL, CKMB, CKMBINDEX, TROPONINI in the last 168 hours. BNP (last 3 results) No results for input(s): PROBNP in the last 8760 hours. HbA1C: No results for input(s): HGBA1C in the last 72 hours. CBG: No results for input(s): GLUCAP in the last 168 hours. Lipid Profile: No results for input(s): CHOL, HDL, LDLCALC, TRIG, CHOLHDL, LDLDIRECT in the last 72 hours. Thyroid Function Tests: No results for input(s): TSH, T4TOTAL, FREET4, T3FREE, THYROIDAB in the last 72 hours. Anemia Panel: No results for input(s): VITAMINB12, FOLATE, FERRITIN, TIBC, IRON, RETICCTPCT in the last 72 hours. Urine analysis:    Component Value Date/Time   COLORURINE STRAW (A) 04/09/2021 1820   APPEARANCEUR HAZY (A) 04/09/2021 1820   LABSPEC 1.030 04/09/2021 1820   PHURINE 7.0 04/09/2021 1820   GLUCOSEU NEGATIVE  04/09/2021 1820   HGBUR NEGATIVE 04/09/2021 1820   BILIRUBINUR NEGATIVE 04/09/2021 1820   KETONESUR 20 (A) 04/09/2021 1820   PROTEINUR NEGATIVE 04/09/2021 1820   NITRITE NEGATIVE 04/09/2021 1820   LEUKOCYTESUR NEGATIVE 04/09/2021 1820    Radiological Exams on Admission: CT Angio Head W or Wo Contrast  Result Date: 04/09/2021 CLINICAL DATA:  Dizziness, nonspecific; dissection on MRI. Follow-up CTA. EXAM: CT ANGIOGRAPHY HEAD AND NECK TECHNIQUE: Multidetector CT imaging of the head and neck was performed using the standard protocol during bolus administration of intravenous contrast. Multiplanar CT image reconstructions and MIPs were obtained to evaluate the vascular anatomy. Carotid stenosis measurements (when applicable) are obtained utilizing NASCET criteria, using the distal internal carotid diameter as the denominator. CONTRAST:  59m OMNIPAQUE IOHEXOL 350 MG/ML SOLN COMPARISON:  Brain MRI 04/09/2021.  Cervical spine MRI 04/09/2021. FINDINGS: CT HEAD FINDINGS Brain: Cerebral volume is normal. Redemonstrated acute infarction within the inferior left cerebellum (PICA vascular territory), measuring 4.5 x 2.3 cm  in transaxial dimensions (for instance as seen on series 5, image 7). Unchanged mild petechial hemorrhage within the infarction territory. No significant mass effect or fourth ventricular effacement at this time. There is no acute intracranial hemorrhage. No demarcated cortical infarct. No extra-axial fluid collection. No evidence of intracranial mass. No midline shift. Vascular: Reported below. Skull: Normal. Negative for fracture or focal lesion. Sinuses: No significant paranasal sinus disease. Orbits: No mass or acute finding. Review of the MIP images confirms the above findings CTA NECK FINDINGS Aortic arch: Standard aortic branching. The visualized aortic arch is normal in caliber. No hemodynamically significant innominate or proximal subclavian artery stenosis. Right carotid system: CCA and  ICA patent within the neck without stenosis. No significant atherosclerotic disease. Minimal irregularity of the proximal to mid cervical ICA (for instance as seen on series 13, image 73). Left carotid system: CCA and ICA patent within the neck without stenosis. No significant atherosclerotic disease. Vertebral arteries: The right vertebral artery is slightly dominant. The right vertebral artery is patent. However, there is a diminutive and slightly irregular appearance of the right vertebral artery at the C1-C2 level (for instance as seen on series 10, image 153). Left vertebral artery is patent and unremarkable in appearance to the C2 level. However, the C1-C2 level, the left vertebral artery is markedly irregular and diminutive with sites of high-grade near occlusive stenosis. V3 and V4 left vertebral artery is patent. However, the proximal V4 left vertebral artery is also irregular and markedly diminutive with additional sites of high-grade stenosis. Skeleton: No acute bony abnormality or aggressive osseous lesion. Nonspecific reversal of the expected cervical lordosis. Other neck: No neck mass or cervical lymphadenopathy. Thyroid unremarkable. Upper chest: No consolidation within the imaged lung apices. Review of the MIP images confirms the above findings CTA HEAD FINDINGS Anterior circulation: The intracranial internal carotid arteries are patent. The M1 middle cerebral arteries are patent. No M2 proximal branch occlusion or high-grade proximal stenosis is identified. The intracranial internal carotid arteries are patent. Posterior circulation: The intracranial right vertebral artery is patent without significant stenosis. As described above, the proximal V4 left vertebral artery is irregular and markedly diminutive with sites of high-grade stenosis. Enhancement is seen within the proximal left PICA. The basilar artery is patent. The basilar artery is developmentally diminutive. The posterior cerebral arteries  are patent. The P1 segments are hypoplastic bilaterally and sizable bilateral posterior communicating arteries are present. There is a slightly bulbous appearance of the basilar tip, measuring 3 mm in transverse dimension (versus 1-2 mm more proximally There is a slightly bulbous appearance of the basilar tip, measuring 3 mm in transverse dimensions (versus 1-2 mm) (series 13, image 100). Venous sinuses: Within the limitations of contrast timing, no convincing thrombus. Anatomic variants: As described Review of the MIP images confirms the above findings Emergent results were called by telephone at the time of interpretation on 04/09/2021 at 4:50 pm to provider PA Kenton Kingfisher, who verbally acknowledged these results. IMPRESSION: CT head: Redemonstrated acute infarction within the inferior left cerebellum, measuring 4.5 x 2.3 cm (PICA territory). Unchanged mild associated petechial hemorrhage. No significant mass effect or fourth ventricular effacement at this time. CTA head & neck: 1. The left vertebral artery is markedly irregular and diminutive with sites of high-grade, near-occlusive stenosis at the C1-C2 level. The left vertebral artery is patent at the V3 and V4 levels. However, the proximal V4 segment is also irregular and are markedly diminutive with sites of high-grade stenosis. Findings are highly suspicious for  vertebral artery dissection. Enhancement is present within the proximal left PICA. 2. In addition to the left vertebral artery findings described above. The right vertebral artery is diminutive and slightly irregular in appearance at the C1-C2 level. Additionally subtle irregularity of the proximal-to-mid cervical right ICA. These findings raise suspicion for an underlying vasculopathy (such as fibromuscular dysplasia) or large vessel vasculitis. 3. Slightly bulbous appearance of the basilar artery tip, measuring 3 mm in transverse dimension. A small basilar tip aneurysm cannot be excluded, and CTA or MRA  follow-up should be considered. Electronically Signed   By: Kellie Simmering DO   On: 04/09/2021 16:53   CT Angio Neck W and/or Wo Contrast  Result Date: 04/09/2021 CLINICAL DATA:  Dizziness, nonspecific; dissection on MRI. Follow-up CTA. EXAM: CT ANGIOGRAPHY HEAD AND NECK TECHNIQUE: Multidetector CT imaging of the head and neck was performed using the standard protocol during bolus administration of intravenous contrast. Multiplanar CT image reconstructions and MIPs were obtained to evaluate the vascular anatomy. Carotid stenosis measurements (when applicable) are obtained utilizing NASCET criteria, using the distal internal carotid diameter as the denominator. CONTRAST:  47m OMNIPAQUE IOHEXOL 350 MG/ML SOLN COMPARISON:  Brain MRI 04/09/2021.  Cervical spine MRI 04/09/2021. FINDINGS: CT HEAD FINDINGS Brain: Cerebral volume is normal. Redemonstrated acute infarction within the inferior left cerebellum (PICA vascular territory), measuring 4.5 x 2.3 cm in transaxial dimensions (for instance as seen on series 5, image 7). Unchanged mild petechial hemorrhage within the infarction territory. No significant mass effect or fourth ventricular effacement at this time. There is no acute intracranial hemorrhage. No demarcated cortical infarct. No extra-axial fluid collection. No evidence of intracranial mass. No midline shift. Vascular: Reported below. Skull: Normal. Negative for fracture or focal lesion. Sinuses: No significant paranasal sinus disease. Orbits: No mass or acute finding. Review of the MIP images confirms the above findings CTA NECK FINDINGS Aortic arch: Standard aortic branching. The visualized aortic arch is normal in caliber. No hemodynamically significant innominate or proximal subclavian artery stenosis. Right carotid system: CCA and ICA patent within the neck without stenosis. No significant atherosclerotic disease. Minimal irregularity of the proximal to mid cervical ICA (for instance as seen on series  13, image 73). Left carotid system: CCA and ICA patent within the neck without stenosis. No significant atherosclerotic disease. Vertebral arteries: The right vertebral artery is slightly dominant. The right vertebral artery is patent. However, there is a diminutive and slightly irregular appearance of the right vertebral artery at the C1-C2 level (for instance as seen on series 10, image 153). Left vertebral artery is patent and unremarkable in appearance to the C2 level. However, the C1-C2 level, the left vertebral artery is markedly irregular and diminutive with sites of high-grade near occlusive stenosis. V3 and V4 left vertebral artery is patent. However, the proximal V4 left vertebral artery is also irregular and markedly diminutive with additional sites of high-grade stenosis. Skeleton: No acute bony abnormality or aggressive osseous lesion. Nonspecific reversal of the expected cervical lordosis. Other neck: No neck mass or cervical lymphadenopathy. Thyroid unremarkable. Upper chest: No consolidation within the imaged lung apices. Review of the MIP images confirms the above findings CTA HEAD FINDINGS Anterior circulation: The intracranial internal carotid arteries are patent. The M1 middle cerebral arteries are patent. No M2 proximal branch occlusion or high-grade proximal stenosis is identified. The intracranial internal carotid arteries are patent. Posterior circulation: The intracranial right vertebral artery is patent without significant stenosis. As described above, the proximal V4 left vertebral artery is irregular  and markedly diminutive with sites of high-grade stenosis. Enhancement is seen within the proximal left PICA. The basilar artery is patent. The basilar artery is developmentally diminutive. The posterior cerebral arteries are patent. The P1 segments are hypoplastic bilaterally and sizable bilateral posterior communicating arteries are present. There is a slightly bulbous appearance of the  basilar tip, measuring 3 mm in transverse dimension (versus 1-2 mm more proximally There is a slightly bulbous appearance of the basilar tip, measuring 3 mm in transverse dimensions (versus 1-2 mm) (series 13, image 100). Venous sinuses: Within the limitations of contrast timing, no convincing thrombus. Anatomic variants: As described Review of the MIP images confirms the above findings Emergent results were called by telephone at the time of interpretation on 04/09/2021 at 4:50 pm to provider PA Kenton Kingfisher, who verbally acknowledged these results. IMPRESSION: CT head: Redemonstrated acute infarction within the inferior left cerebellum, measuring 4.5 x 2.3 cm (PICA territory). Unchanged mild associated petechial hemorrhage. No significant mass effect or fourth ventricular effacement at this time. CTA head & neck: 1. The left vertebral artery is markedly irregular and diminutive with sites of high-grade, near-occlusive stenosis at the C1-C2 level. The left vertebral artery is patent at the V3 and V4 levels. However, the proximal V4 segment is also irregular and are markedly diminutive with sites of high-grade stenosis. Findings are highly suspicious for vertebral artery dissection. Enhancement is present within the proximal left PICA. 2. In addition to the left vertebral artery findings described above. The right vertebral artery is diminutive and slightly irregular in appearance at the C1-C2 level. Additionally subtle irregularity of the proximal-to-mid cervical right ICA. These findings raise suspicion for an underlying vasculopathy (such as fibromuscular dysplasia) or large vessel vasculitis. 3. Slightly bulbous appearance of the basilar artery tip, measuring 3 mm in transverse dimension. A small basilar tip aneurysm cannot be excluded, and CTA or MRA follow-up should be considered. Electronically Signed   By: Kellie Simmering DO   On: 04/09/2021 16:53   MR BRAIN WO CONTRAST  Result Date: 04/09/2021 CLINICAL DATA:   Chronic headache with increasing frequency. Pain worse on the left. Dizziness and vomiting. EXAM: MRI HEAD WITHOUT CONTRAST TECHNIQUE: Multiplanar, multiecho pulse sequences of the brain and surrounding structures were obtained without intravenous contrast. COMPARISON:  Cervical spine CT 1 week ago. FINDINGS: Brain: 3-4 cm acute infarction within the inferior cerebellum on the left consistent with PICA distribution infarction. No evidence medullary involvement. Low level blood products within the region of infarction without evidence of frank hematoma. Otherwise, brain has normal appearance without evidence of malformation, atrophy, other old or acute infarction mass lesion, hydrocephalus or extra-axial collection. Vascular: Question abnormal flow in the left vertebral artery. Skull and upper cervical spine: Negative Sinuses/Orbits: Clear/normal Other: None IMPRESSION: 1. 3-4 cm acute infarction in the inferior cerebellum on the left consistent with PICA distribution infarction. Low level blood products within the region of infarction without frank hematoma. 2. Question abnormal flow in the left vertebral artery. Recommend vascular evaluation, with the possibility of left vertebral dissection. Electronically Signed   By: Nelson Chimes M.D.   On: 04/09/2021 15:03   MR Cervical Spine Wo Contrast  Result Date: 04/09/2021 CLINICAL DATA:  Neck pain on the left. Left PICA infarction by brain MRI. EXAM: MRI CERVICAL SPINE WITHOUT CONTRAST TECHNIQUE: Multiplanar, multisequence MR imaging of the cervical spine was performed. No intravenous contrast was administered. COMPARISON:  Brain MRI same day FINDINGS: Alignment: Normal Vertebrae: Normal Cord: Normal Posterior Fossa, vertebral arteries, paraspinal  tissues: Acute infarction inferior cerebellum on the left. Question abnormal flow within the proximal left vertebral artery. Flow does appear to be present in the more distal vessel. Vascular evaluation suggested for  possibility left vertebral dissection. Disc levels: No significant disc finding. Minimal non-compressive disc bulges C5-6 and C6-7. No facet arthropathy. 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 Question abnormal flow in the proximal left vertebral artery. Vascular evaluation suggested for possibility of left vertebral dissection. Electronically Signed   By: Nelson Chimes M.D.   On: 04/09/2021 15:05    EKG: Independently reviewed.  Sinus tachycardia  Assessment/Plan Active Problems:   CVA (cerebral vascular accident) ([place])  (please populate well all problems here in Problem List. (For example, if patient is on BP meds at home and you resume or decide to hold them, it is a problem that needs to be her. 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 -Symptomatic management with meclizine,  Acute headache -Likely from dissection, Tylenol alternated with narcotics -As needed cocktail of Reglan and Benadryl  Elevated BP without diagnosis of HTN -Start Coreg.  DVT prophylaxis: SCD, no chemical DVT prophylaxis given petechial hemorrhage within CVA infarction site  code Status: Full Code Family Communication: Husband at bedside Disposition Plan: Expect 1 to 2 days hospital stay to treat CVA, may need rehab. Consults called: Neuro Admission status: Tele admit   Lequita Halt MD [facility] Pager [phone]  04/09/2021, 7:26 PM

## 2021-04-09 NOTE — ED Provider Notes (Signed)
Patient taken in sign out from [name].  She is a 30 year old female here with headache for 1 week sent in by her chiropractor with dizziness and vision changes.  Found to have acute infarct in the left cerebellar region.  Will need admission.  Awaiting Dr. Rollene Fare go ahead for admission.      4:55 PM Called from radiology with results of CTA.shoows dissection on the left near total occlusion also irregularities on the R -concerning for potential vasculitis or other vasculopathy / fibromuscular displasia  Patient with acute stroke. Admitted to [facility], Dr. Chipper Herb. Patient stable.  .Critical Care Performed by: Arthor Captain, PA-C Authorized by: Arthor Captain, PA-C   Critical care provider statement:    Critical care time (minutes):  50   Critical care time was exclusive of:  Separately billable procedures and treating other patients   Critical care was necessary to treat or prevent imminent or life-threatening deterioration of the following conditions:  CNS failure or compromise   Critical care was time spent personally by me on the following activities:  Discussions with consultants, evaluation of patient's response to treatment, examination of patient, ordering and performing treatments and interventions, ordering and review of laboratory studies, ordering and review of radiographic studies, pulse oximetry, re-evaluation of patient's condition, obtaining history from patient or surrogate and review of old charts      Arthor Captain, PA-C 04/09/21 2054    Charlynne Pander, MD 04/09/21 [id number]

## 2021-04-09 NOTE — ED Provider Notes (Signed)
[facility] Provider Note   CSN: [id number] Arrival date & time: 04/09/21  1039     History Chief Complaint  Patient presents with  . Neck Pain  . Dizziness    Caroline Graham is a 30 y.o. female.  HPI Patient is a 30 year old female with a medical history as noted below.  Patient was initially seen 1 week ago after having been experiencing pain in the left neck at the base of the occiput that was radiating across the left side of her head.  She went to her chiropractor who did not do an adjustment and recommended that she come to the emergency department for evaluation.  She was evaluated on 4/13 and had a reassuring CT scan of the cervical spine.  Patient states that since being discharged her symptoms have continued.  Denies they have worsened.  States that her pain is so severe at times that sometimes the wind will blow and cause it to worsen.  Denies any numbness, tingling, weakness, slurred speech, visual changes.  States she has not been reevaluated by her chiropractor since her visit 1 week ago and has not had a chiropractic adjustment for more than 1 year.  Patient states that yesterday morning she woke up with severe dizziness.  She states it felt as if the room was spinning.  She had intermittent episodes of this throughout the day as well as intermittent episodes of nausea and vomiting.  States her symptoms appear to worsen with head movement.  She states her symptoms today are mildly improved but still feels "uneasy" as if "she is on a boat".  Patient denies any chest pain or shortness of breath.  No current nausea.  Last episode of vomiting was about 30 minutes prior to arrival.    History reviewed. No pertinent past medical history.  There are no problems to display for this patient.   Past Surgical History:  Procedure Laterality Date  . KNEE SURGERY    . TONSILLECTOMY       OB History   No obstetric history on file.      No family history on file.  Social History   Tobacco Use  . Smoking status: Current Every Day Smoker  Substance Use Topics  . Alcohol use: Yes  . Drug use: No    Home Medications Prior to Admission medications   Medication Sig Start Date End Date Taking? Authorizing Provider  methocarbamol (ROBAXIN) 500 MG tablet Take 1 tablet (500 mg total) by mouth 2 (two) times daily. Patient taking differently: Take 500 mg by mouth at bedtime. 04/02/21  Yes Mare FerrariBelaya, Maria A, PA-C  meloxicam (MOBIC) 15 MG tablet Take 1 tablet (15 mg total) by mouth daily. Patient not taking: No sig reported 01/23/14   Graylon GoodBaker, Zachary H, PA-C  methylPREDNISolone (MEDROL DOSEPAK) 4 MG TBPK tablet Please take as directed until finished Patient not taking: No sig reported 04/02/21   Mare FerrariBelaya, Maria A, PA-C  traMADol (ULTRAM) 50 MG tablet Take 1 tablet (50 mg total) by mouth every 6 (six) hours as needed. Patient not taking: No sig reported 01/23/14   Graylon GoodBaker, Zachary H, PA-C    Allergies    Ceclor [cefaclor] and Penicillins  Review of Systems   Review of Systems  All other systems reviewed and are negative. Ten systems reviewed and are negative for acute change, except as noted in the HPI.    Physical Exam Updated Vital Signs BP (!) 134/94 (BP Location: Left Arm)  Pulse 87   Temp 98 F (36.7 C) (Oral)   Resp 17   LMP 03/18/2021   SpO2 100%   Physical Exam Vitals and nursing note reviewed.  Constitutional:      General: She is not in acute distress.    Appearance: Normal appearance. She is not ill-appearing, toxic-appearing or diaphoretic.  HENT:     Head: Normocephalic and atraumatic.     Comments: No tenderness appreciated along the scalp.  No crepitus.    Right Ear: Tympanic membrane, ear canal and external ear normal. There is no impacted cerumen.     Left Ear: Tympanic membrane, ear canal and external ear normal. There is no impacted cerumen.     Ears:     Comments: Bilateral ears, EACs, and TMs  appear normal.    Nose: Nose normal.     Mouth/Throat:     Mouth: Mucous membranes are moist.     Pharynx: Oropharynx is clear. No oropharyngeal exudate or posterior oropharyngeal erythema.  Eyes:     General: No scleral icterus.       Right eye: No discharge.        Left eye: No discharge.     Extraocular Movements: Extraocular movements intact.     Conjunctiva/sclera: Conjunctivae normal.     Pupils: Pupils are equal, round, and reactive to light.     Comments: PERRL. EOMI.  Neck:     Comments: No midline spine pain.  Moderate pain noted along the base of the left occiput. Cardiovascular:     Rate and Rhythm: Normal rate and regular rhythm.     Pulses: Normal pulses.     Heart sounds: Normal heart sounds. No murmur heard. No friction rub. No gallop.   Pulmonary:     Effort: Pulmonary effort is normal. No respiratory distress.     Breath sounds: Normal breath sounds. No stridor. No wheezing, rhonchi or rales.  Abdominal:     General: Abdomen is flat.     Palpations: Abdomen is soft.     Tenderness: There is no abdominal tenderness.  Musculoskeletal:        General: Normal range of motion.     Cervical back: Normal range of motion and neck supple. No tenderness.  Skin:    General: Skin is warm and dry.  Neurological:     General: No focal deficit present.     Mental Status: She is alert and oriented to person, place, and time.     Comments: Patient is oriented to person, place, and time. Patient phonates in clear, complete, and coherent sentences. Negative arm drift. Strength is 5/5 in all four extremities. Distal sensation intact in all four extremities.  Nausea and lightheadedness worsens with movement of the head.  Psychiatric:        Mood and Affect: Mood normal.        Behavior: Behavior normal.    ED Results / Procedures / Treatments   Labs (all labs ordered are listed, but only abnormal results are displayed) Labs Reviewed  COMPREHENSIVE METABOLIC PANEL - Abnormal;  Notable for the following components:      Result Value   AST 14 (*)    All other components within normal limits  CBC WITH DIFFERENTIAL/PLATELET - Abnormal; Notable for the following components:   WBC 13.2 (*)    Platelets 449 (*)    Neutro Abs 8.5 (*)    All other components within normal limits  URINALYSIS, ROUTINE W REFLEX MICROSCOPIC  I-STAT  BETA HCG BLOOD, ED ([facility], [facility], [facility] ONLY)   EKG EKG Interpretation  Date/Time:  Wednesday April 09 2021 10:56:11 EDT Ventricular Rate:  101 PR Interval:  126 QRS Duration: 90 QT Interval:  329 QTC Calculation: 427 R Axis:   81 Text Interpretation: Sinus tachycardia Probable left atrial enlargement RSR' in V1 or V2, right VCD or RVH Baseline wander in lead(s) V4 Confirmed by Margarita Grizzle [phone]) on 04/09/2021 3:36:54 PM  Radiology MR BRAIN WO CONTRAST  Result Date: 04/09/2021 CLINICAL DATA:  Chronic headache with increasing frequency. Pain worse on the left. Dizziness and vomiting. EXAM: MRI HEAD WITHOUT CONTRAST TECHNIQUE: Multiplanar, multiecho pulse sequences of the brain and surrounding structures were obtained without intravenous contrast. COMPARISON:  Cervical spine CT 1 week ago. FINDINGS: Brain: 3-4 cm acute infarction within the inferior cerebellum on the left consistent with PICA distribution infarction. No evidence medullary involvement. Low level blood products within the region of infarction without evidence of frank hematoma. Otherwise, brain has normal appearance without evidence of malformation, atrophy, other old or acute infarction mass lesion, hydrocephalus or extra-axial collection. Vascular: Question abnormal flow in the left vertebral artery. Skull and upper cervical spine: Negative Sinuses/Orbits: Clear/normal Other: None IMPRESSION: 1. 3-4 cm acute infarction in the inferior cerebellum on the left consistent with PICA distribution infarction. Low level blood products within the region of infarction without frank hematoma. 2.  Question abnormal flow in the left vertebral artery. Recommend vascular evaluation, with the possibility of left vertebral dissection. Electronically Signed   By: Paulina Fusi M.D.   On: 04/09/2021 15:03   MR Cervical Spine Wo Contrast  Result Date: 04/09/2021 CLINICAL DATA:  Neck pain on the left. Left PICA infarction by brain MRI. EXAM: MRI CERVICAL SPINE WITHOUT CONTRAST TECHNIQUE: Multiplanar, multisequence MR imaging of the cervical spine was performed. No intravenous contrast was administered. COMPARISON:  Brain MRI same day FINDINGS: Alignment: Normal Vertebrae: Normal Cord: Normal Posterior Fossa, vertebral arteries, paraspinal tissues: Acute infarction inferior cerebellum on the left. Question abnormal flow within the proximal left vertebral artery. Flow does appear to be present in the more distal vessel. Vascular evaluation suggested for possibility left vertebral dissection. Disc levels: No significant disc finding. Minimal non-compressive disc bulges C5-6 and C6-7. No facet arthropathy. 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 Question abnormal flow in the proximal left vertebral artery. Vascular evaluation suggested for possibility of left vertebral dissection. Electronically Signed   By: Paulina Fusi M.D.   On: 04/09/2021 15:05   Procedures Procedures   Medications Ordered in ED Medications  sodium chloride 0.9 % bolus 1,000 mL (1,000 mLs Intravenous New Bag/Given 04/09/21 1132)  meclizine (ANTIVERT) tablet 25 mg (25 mg Oral Given 04/09/21 1133)  LORazepam (ATIVAN) tablet 0.5 mg (0.5 mg Oral Given 04/09/21 1133)  morphine 4 MG/ML injection 4 mg (4 mg Intravenous Given 04/09/21 1519)  iohexol (OMNIPAQUE) 350 MG/ML injection 75 mL (75 mLs Intravenous Contrast Given 04/09/21 1607)   ED Course  I have reviewed the triage vital signs and the nursing notes.  Pertinent labs & imaging results that were available during my care of the patient were reviewed by me and  considered in my medical decision making (see chart for details).  Clinical Course as of 04/09/21 1628  Wed Apr 09, 2021  1532 I spoke to on-call hospital neurologist.  Recommended CTA of the head and neck.  They are going to discuss with the stroke specialist regarding the patient as well as possible  anticoagulation.  [LJ]    Clinical Course User Index [LJ] Placido Sou, PA-C   MDM Rules/Calculators/A&P                          Pt is a 30 y.o. female who presents the emergency department for appears to be in acute stroke.  Labs: CBC with a white count of 13.2, platelets of 449, neutrophils of 8.5. CMP with an AST of 14. I-STAT beta-hCG less than 5. UA pending.  Imaging: MRI of the brain and cervical spine shows acute infarction of the inferior cerebellum on the left.  Question abnormal flow in the proximal left vertebral artery.  Vascular evaluation suggestive for possibility of left dissection.  I, Placido Sou, PA-C, personally reviewed and evaluated these images and lab results as part of my medical decision-making.  Patient discussed with neurology.  Recommend CTA of the head and neck.  Neurology is going to discuss further with their team regarding patient's admission as well as anticoagulation.  Is in my shift and patient care is being transferred to [facility].  Patient pending further dispo instructions from neurology and ultimately admission to medicine.  Note: Portions of this report may have been transcribed using voice recognition software. Every effort was made to ensure accuracy; however, inadvertent computerized transcription errors may be present.   Final Clinical Impression(s) / ED Diagnoses Final diagnoses:  Cerebellar infarct [facility])   Rx / DC Orders ED Discharge Orders    None       Placido Sou, PA-C 04/09/21 1628    Margarita Grizzle, MD 04/10/21 [phone]

## 2021-04-09 NOTE — ED Triage Notes (Signed)
Pt here with reports of ongoing L sided neck pain radiating up into her head. Pt states yesterday she developed dizziness with emesis. Pt with no facial droop, arm drift.

## 2021-04-09 NOTE — ED Notes (Signed)
Pt given turkey sandwich bag and water  

## 2021-04-09 NOTE — ED Notes (Signed)
Attempted report x1. 

## 2021-04-09 NOTE — ED Notes (Signed)
Floor coverage paged and asked for pain meds for pt and for diet order to be put in
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
## 2021-04-10 ENCOUNTER — Inpatient Hospital Stay (HOSPITAL_COMMUNITY): Payer: 59

## 2021-04-10 DIAGNOSIS — I6389 Other cerebral infarction: Secondary | ICD-10-CM

## 2021-04-10 DIAGNOSIS — I639 Cerebral infarction, unspecified: Secondary | ICD-10-CM

## 2021-04-10 LAB — LIPID PANEL
Cholesterol: 185 mg/dL (ref 0–200)
HDL: 44 mg/dL (ref >40)
LDL Cholesterol: 122 mg/dL — ABNORMAL HIGH (ref 0–99)
Total CHOL/HDL Ratio: 4.2 RATIO
Triglycerides: 97 mg/dL (ref <150)
VLDL: 19 mg/dL (ref 0–40)

## 2021-04-10 LAB — ECHOCARDIOGRAM COMPLETE
AR max vel: 3.4 cm2
AV Area VTI: 3.41 cm2
AV Area mean vel: 3.17 cm2
AV Mean grad: 2 mmHg
AV Peak grad: 4.2 mmHg
Ao pk vel: 1.03 m/s
Area-P 1/2: 3.99 cm2
Calc EF: 60.2 %
Height: 67 in
S' Lateral: 2.6 cm
Single Plane A2C EF: 61.5 %
Single Plane A4C EF: 60.7 %
Weight: 2081.14 oz

## 2021-04-10 LAB — SARS CORONAVIRUS 2 (TAT 6-24 HRS): SARS Coronavirus 2: NEGATIVE

## 2021-04-10 LAB — HEMOGLOBIN A1C
Hgb A1c MFr Bld: 5.2 % (ref 4.8–5.6)
Mean Plasma Glucose: 102.54 mg/dL

## 2021-04-10 MED ORDER — ASPIRIN EC 81 MG PO TBEC
81.0000 mg | DELAYED_RELEASE_TABLET | Freq: Every day | ORAL | Status: DC
  Administered 2021-04-11: 81 mg via ORAL
  Filled 2021-04-10: qty 1

## 2021-04-10 MED ORDER — TRAMADOL HCL 50 MG PO TABS
50.0000 mg | ORAL_TABLET | Freq: Four times a day (QID) | ORAL | Status: DC | PRN
  Administered 2021-04-10 – 2021-04-11 (×2): 50 mg via ORAL
  Filled 2021-04-10 (×2): qty 1

## 2021-04-10 MED ORDER — ATORVASTATIN CALCIUM 40 MG PO TABS
40.0000 mg | ORAL_TABLET | Freq: Every day | ORAL | Status: DC
  Administered 2021-04-10 – 2021-04-11 (×2): 40 mg via ORAL
  Filled 2021-04-10: qty 4
  Filled 2021-04-10: qty 1

## 2021-04-10 MED ORDER — ASPIRIN EC 325 MG PO TBEC
325.0000 mg | DELAYED_RELEASE_TABLET | Freq: Every day | ORAL | Status: DC
  Administered 2021-04-10: 325 mg via ORAL
  Filled 2021-04-10: qty 1

## 2021-04-10 NOTE — Progress Notes (Addendum)
STROKE TEAM PROGRESS NOTE   INTERVAL HISTORY 30 y.o.femalewithnosignificant medical historypresents with worsening of left-sided headache, neck pain, nausea and dizziness for a couple of weeks after riding a motorcycle for a long period of time.  She was initially evaluated by a chiropractor approximately 1week ago.  CT of the cervical spine was obtained which did not show any significant findings.  She presented to the ED where she was found to have an acute left PICA stroke.  CT angiogram showed signs concerning for left vertebral artery dissection and some change in the right vertebral artery concerning for potential prior dissection/fibromuscular dysplasia.  Vitals:   04/10/21 0410 04/10/21 0727 04/10/21 1151 04/10/21 1535  BP: 116/74 119/82 125/81 110/66  Pulse: 78 72 73 67  Resp: 20 20 20 20   Temp: 99 F (37.2 C) 98.8 F (37.1 C) 99.6 F (37.6 C) 99 F (37.2 C)  TempSrc: Oral Oral Oral Oral  SpO2: 100% 100% 100% 100%  Weight:      Height:       CBC:  Recent Labs  Lab 04/09/21 1104  WBC 13.2*  NEUTROABS 8.5*  HGB 14.3  HCT 44.6  MCV 96.5  PLT 449*   Basic Metabolic Panel:  Recent Labs  Lab 04/09/21 1104  NA 138  K 3.7  CL 103  CO2 24  GLUCOSE 87  BUN 11  CREATININE 0.88  CALCIUM 9.7   Lipid Panel:  Recent Labs  Lab 04/10/21 0244  CHOL 185  TRIG 97  HDL 44  CHOLHDL 4.2  VLDL 19  LDLCALC 04/12/21*   HgbA1c:  Recent Labs  Lab 04/10/21 0244  HGBA1C 5.2   Urine Drug Screen: No results for input(s): LABOPIA, COCAINSCRNUR, LABBENZ, AMPHETMU, THCU, LABBARB in the last 168 hours.  Alcohol Level No results for input(s): ETH in the last 168 hours.  IMAGING past 24 hours ECHOCARDIOGRAM COMPLETE  Result Date: 04/10/2021 IMPRESSIONS   1. Left ventricular ejection fraction, by estimation, is 60 to 65%. The left ventricle has normal function. The left ventricle has no regional wall motion abnormalities. Left ventricular diastolic parameters were normal.   2.  Right ventricular systolic function is normal. The right ventricular size is normal.   3. The mitral valve is normal in structure. No evidence of mitral valve regurgitation. No evidence of mitral stenosis.   4. The aortic valve is normal in structure. Aortic valve regurgitation is not visualized. No aortic stenosis is present.   5. The inferior vena cava is normal in size with greater than 50% respiratory variability, suggesting right atrial pressure of 3 mmHg.  PHYSICAL EXAM Neuro: Mental Status: Patient is awake, alert, oriented to person, place, month, year, and situation. Patient is able to give a clear and coherent history. No signs of aphasia or neglect Cranial Nerves: II: Visual Fields are full. Pupils are equal, round, and reactive to light.   III,IV, VI: EOMI without ptosis or diploplia.  No significant nystagmus V: Facial sensation is symmetric to temperature VII: Facial movement is symmetric.  VIII: hearing is intact to voice X: Uvula elevates symmetrically XI: Shoulder shrug is symmetric. XII: tongue is midline without atrophy or fasciculations.  Motor: Tone is normal. Bulk is normal. 5/5 strength was present in all four extremities.  Sensory: Sensation is symmetric to light touch and temperature in the arms and legs. Deep Tendon Reflexes: 2+ and symmetric in the biceps and patellae.  Plantars: Toes are downgoing bilaterally.  Cerebellar: FNF and HKS are intact bilaterally, very mild  tremor at end range  ASSESSMENT/PLAN [name] is a 30 y.o. female with no significant PMH presenting with headache, neck pain, and dizziness.    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 right vertebral artery is diminutive and slightly irregular  MRI left PICA infarct  Repeat CT in a.m. pending  2D Echo: EF 60-65%  LDL 122  HgbA1c 5.2  UDS pending  VTE prophylaxis - SCDs  Diet:  heart healthy  No antithrombotic prior to admission, now on aspirin 325 mg daily and clopidogrel 75 mg daily.  Continue DAPT for 3 months and then aspirin 81 alone.  Therapy recommendations:  Outpatient PT  Disposition:  pending  Bilateral vertebral artery dissection  Patient has neck in extended position for prolonged time prior to symptom onset  CTA head & neck left vertebral artery high grade stenosis at C1 level. right vertebral artery is diminutive and stenosis also at the C1 level.  On DAPT  Repeat CTA head and neck in 2 to 3 months for monitoring  Hyperlipidemia  Home meds:  none  LDL 122, goal < 70  Add Lipitor 40mg  daily  Continue statin at discharge  ? BA tip small aneurysm  CTA head and neck showed slightly bulbous appearance of the basilar artery tip, measuring 3 mm in transverse dimension. A small basilar tip aneurysm cannot be excluded   CTA or MRA follow-up should be considered  Other Active Problems  Headaches: Dilaudid 0.5mg  q4 hr prn, Benadryl and reglan combo, tramadol 50mg  q6hr prn  Dizziness: meclizine  Nausea: zofran  Hospital day # 1  [name], ACNP-BC Stroke NP  ATTENDING NOTE: I reviewed above note and agree with the assessment and plan. Pt was seen and examined.   30 year old female with no past medical history admitted for left neck pain, vertigo nausea vomiting.  MRI showed left cerebellar PICA infarct.  CT head and neck showed left VA near occlusion in the right VA stenosis both at C1 level.  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  EF 60 to 65%, A1c 5.2, LDL 122.  Creatinine 0.88, WBC 13.2.  On exam, patient lying in bed, awake alert orientated x3.  No aphasia, follows simple commands.  Neurological examination intact, no nystagmus, or ataxia or eye disconjugate.  Patient stroke likely due to vertebral dissection secondary to neck extension position for extended period.  No intraluminal thrombus or  worsening neuro symptoms that warranted for anticoagulation, currently on aspirin 325 and Plavix 75 DAPT for 3 months and then aspirin alone.  Continue Lipitor 40.  We will repeat a CT in a.m. to rule out cerebral edema or hydrocephalus.  Repeat CTA head and neck in 2 to 3 months to monitor VA dissection.  Regular CTA or MRA to monitor questionable BA tip aneurysm is also reasonable.  PT/OT recommend outpatient PT/OT.  Will follow.  For detailed assessment and plan, please refer to above as I have made changes wherever appropriate.   , MD PhD Stroke Neurology 04/10/2021 11:36 PM    To contact Stroke Continuity provider, please refer to [facility] Plan. After hours, contact General Neurology
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
## 2021-04-10 NOTE — Progress Notes (Signed)
  Echocardiogram 2D Echocardiogram has been performed.  Caroline Graham  [name] 04/10/2021, 3:10 PM

## 2021-04-10 NOTE — Progress Notes (Signed)
Pt has arrived to unit and is now resting in bed.

## 2021-04-10 NOTE — Progress Notes (Signed)
Occupational Therapy Evaluation  PTA pt lives independently with her husband and works as a Librarian, academic at [employer]. Pt continues to complain of dizziness, especially with head movement but states "it's better". Educated pt on compensatory strategies to increase independence and safety with ADL tasks. Educated pt on VOR x1 exercise and fine motor/coordination activities. Recommend follow up with OT at the neuro outpt center to facilitate safe return to work - discussed with nsg/CM. Educated on signs/symptoms of CVA using BeFAst. Mom/pt verbalized understanding. All further OT to be addressed in outpt setting.     04/10/21 1100  OT Visit Information  Last OT Received On 04/10/21  Assistance Needed +1  History of Present Illness 30 y/o female presented to ED on 4/20 with complaints of L sidedneck pain traveling into head, and dizziness with emesis. Sent to ED by chiropractor on 4/13 for similar complaints and d/c'ed with stretches and medication. 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 CT head & neck - Findings are highly  suspicious for vertebral artery dissection. No significant PMH.  Precautions  Precautions Fall  Home Living  Family/patient expects to be discharged to: Private residence  Living Arrangements Spouse/significant other  Available Help at Discharge Family;Available 24 hours/day  Type of Home House  Home Access Stairs to enter  Entrance Stairs-Number of Steps 2  Entrance Stairs-Rails None  Home Layout Two level  Alternate Level Stairs-Number of Steps flight  Alternate Level Stairs-Rails Left  Barrister's clerk None  Prior Function  Level of Independence Independent  Comments working at [employer] as Librarian, academic, driving  Communication  Communication No difficulties  Pain Assessment  Pain Assessment 0-10  Pain Score 2  Pain  Location neck and back of head  Pain Descriptors / Indicators Discomfort;Pressure;Radiating;Sharp  Pain Intervention(s) Limited activity within patient's tolerance  Cognition  Arousal/Alertness Awake/alert  Behavior During Therapy WFL for tasks assessed/performed  Overall Cognitive Status Within Functional Limits for tasks assessed  Upper Extremity Assessment  Upper Extremity Assessment LUE deficits/detail  LUE Deficits / Details mild coordination deficits; functional (strength overall WFL)  LUE Coordination decreased fine motor  Lower Extremity Assessment  Lower Extremity Assessment Defer to PT evaluation  Cervical / Trunk Assessment  Cervical / Trunk Assessment Normal  ADL  Overall ADL's  Needs assistance/impaired  Functional mobility during ADLs Supervision/safety  General ADL Comments Able to complete ADL tasks with set up. Educated on compensatory strategies during ADL tasks using figure four positioning and using visual targets as needed to reduce symptoms of dizziness. REcommend using a showerchari initially which she can borrow form Mom. Educated on home set up to minimize sypmtoms of dizziness adn decrease risk of falls. Mom/pt verbalized understanding.  Vision- History  Baseline Vision/History No visual deficits  Vision- Assessment  Vision Assessment? Yes;No apparent visual deficits  Eye Alignment WFL  Ocular Range of Motion [facility]  Alignment/Gaze Preference WDL  Tracking/Visual Pursuits Able to track stimulus in all quads without difficulty  Saccades [facility]  Visual Fields No apparent deficits  Perception  Comments no apparent deficits  Praxis  Praxis tested? WFL  Bed Mobility  Overal bed mobility Modified Independent  Transfers  Overall transfer level Needs assistance  Equipment used None  Transfers Sit to/from Stand  Sit to Stand Supervision  General transfer comment supervision for safety  Balance  Overall balance assessment Mild deficits observed, not formally  tested  General Comments  General comments (skin integrity, edema, etc.) Educated on warning signs/symptoms of CVA using BeFast  Exercises  Exercises Other exercises  Other Exercises  Other Exercises VOR x1 exercise  OT - End of Session  Activity Tolerance Patient tolerated treatment well  Patient left in chair;with call bell/phone within reach;with chair alarm set;with family/visitor present  Nurse Communication Mobility status;Other (comment) (DC needs)  OT Assessment  OT Recommendation/Assessment All further OT needs can be met in the next venue of care  OT Visit Diagnosis Unsteadiness on feet (R26.81);Dizziness and giddiness (R42);Pain  OT Problem List Decreased activity tolerance;Impaired balance (sitting and/or standing);Decreased coordination;Decreased safety awareness;Decreased knowledge of use of DME or AE;Impaired UE functional use;Pain  AM-PAC OT "6 Clicks" Daily Activity Outcome Measure (Version 2)  Help from another person eating meals? 4  Help from another person taking care of personal grooming? 3  Help from another person toileting, which includes using toliet, bedpan, or urinal? 3  Help from another person bathing (including washing, rinsing, drying)? 3  Help from another person to put on and taking off regular upper body clothing? 3  Help from another person to put on and taking off regular lower body clothing? 3  6 Click Score 19  OT Recommendation  Follow Up Recommendations Outpatient OT;Supervision - Intermittent (neuro outpt)  OT Equipment None recommended by OT  Individuals Consulted  Consulted and Agree with Results and Recommendations Patient;Family member/caregiver  Family Member Consulted Mom  Acute Rehab OT Goals  Patient Stated Goal to get better  OT Goal Formulation All assessment and education complete, DC therapy  OT Time Calculation  OT Start Time (ACUTE ONLY) 1010  OT Stop Time (ACUTE ONLY) 1033  OT Time Calculation (min) 23 min  OT General  Charges  $OT Visit 1 Visit  OT Evaluation  $OT Eval Low Complexity 1 Low  OT Treatments  $Self Care/Home Management  8-22 mins  Written Expression  Dominant Hand Right  Maurie Boettcher, OT/L   Acute OT Clinical Specialist [place] Pager [phone] Office [phone]

## 2021-04-10 NOTE — Evaluation (Signed)
Physical Therapy Evaluation Patient Details Name: Caroline Graham MRN: [id number] DOB: 06-07-1991 Today's Date: 04/10/2021   History of Present Illness  30 y/o female presented to ED on 4/20 with complaints of L sidedneck pain traveling into head, and dizziness with emesis. Sent to ED by chiropractor on 4/13 for similar complaints and d/c'ed with stretches and medication. 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 CT head & neck - Findings are highly  suspicious for vertebral artery dissection. No significant PMH.  Clinical Impression  PTA, patient lives with husband and reports independence with mobility, working, and driving. Patient presents with impaired balance and impaired coordination. Patient overall requires supervision for OOB mobility with no AD. Patient scored 17/24 on DGI which is indicative of increased fall risk. Educated patient on fall risk and need for supervision initially upon returning home. Patient will benefit from skilled PT services during acute stay to address listed deficits. Recommend OPPT following discharge to address balance and coordination deficits.     Follow Up Recommendations Outpatient PT;Supervision for mobility/OOB    Equipment Recommendations  None recommended by PT    Recommendations for Other Services       Precautions / Restrictions Precautions Precautions: Fall Restrictions Weight Bearing Restrictions: No      Mobility  Bed Mobility Overal bed mobility: Modified Independent                  Transfers Overall transfer level: Needs assistance Equipment used: None Transfers: Sit to/from Stand Sit to Stand: Supervision         General transfer comment: supervision for safety  Ambulation/Gait Ambulation/Gait assistance: Min guard;Supervision Gait Distance (Feet): 250 Feet Assistive device: None Gait Pattern/deviations: Step-through pattern;Decreased  stride length Gait velocity: decreased   General Gait Details: very hesitant during ambulation due to unsteadiness. Cues required for relaxation with improved gait quality and fluidity. Mild ataxia noted during ambulation. Min guard initially and during DGI, progressed to supervision.  Stairs Stairs: Yes Stairs assistance: Supervision Stair Management: No rails;Forwards;Step to pattern;Alternating pattern Number of Stairs: 4 General stair comments: Initially patient hesitant and performed stairs with step to pattern. Encouraged patient to negotiate stairs like she would at baseline. Performed with alternating pattern with no rails and supervision  Wheelchair Mobility    Modified Rankin (Stroke Patients Only) Modified Rankin (Stroke Patients Only) Pre-Morbid Rankin Score: No symptoms Modified Rankin: Moderately severe disability     Balance Overall balance assessment: Mild deficits observed, not formally tested                               Standardized Balance Assessment Standardized Balance Assessment : Dynamic Gait Index   Dynamic Gait Index Level Surface: Mild Impairment Change in Gait Speed: Mild Impairment Gait with Horizontal Head Turns: Mild Impairment Gait with Vertical Head Turns: Mild Impairment Gait and Pivot Turn: Mild Impairment Step Over Obstacle: Mild Impairment Step Around Obstacles: Mild Impairment Steps: Normal Total Score: 17       Pertinent Vitals/Pain Pain Assessment: 0-10 Pain Score: 2  Pain Location: neck and back of head Pain Descriptors / Indicators: Discomfort;Pressure;Radiating;Sharp Pain Intervention(s): Monitored during session;Repositioned    Home Living Family/patient expects to be discharged to:: Private residence Living Arrangements: Spouse/significant other Available Help at Discharge: Family;Available 24 hours/day Type of Home: House Home Access: Stairs to enter Entrance Stairs-Rails: None Entrance Stairs-Number of  Steps: 2 Home Layout:  Two level Home Equipment: None      Prior Function Level of Independence: Independent         Comments: working, driving     Higher education careers adviser        Extremity/Trunk Assessment   Upper Extremity Assessment Upper Extremity Assessment: Defer to OT evaluation    Lower Extremity Assessment Lower Extremity Assessment: LLE deficits/detail LLE Deficits / Details: B LE strength 4+/5 LLE Coordination: decreased gross motor;decreased fine motor    Cervical / Trunk Assessment Cervical / Trunk Assessment: Normal  Communication   Communication: No difficulties  Cognition Arousal/Alertness: Awake/alert Behavior During Therapy: WFL for tasks assessed/performed Overall Cognitive Status: Within Functional Limits for tasks assessed                                        General Comments General comments (skin integrity, edema, etc.): mother present and supportive    Exercises     Assessment/Plan    PT Assessment Patient needs continued PT services  PT Problem List Decreased strength;Decreased activity tolerance;Decreased balance;Decreased coordination       PT Treatment Interventions Gait training;Stair training;Functional mobility training;Therapeutic activities;Balance training;Therapeutic exercise;Neuromuscular re-education;Patient/family education    PT Goals (Current goals can be found in the Care Plan section)  Acute Rehab PT Goals Patient Stated Goal: to get better PT Goal Formulation: With patient Time For Goal Achievement: 04/24/21 Potential to Achieve Goals: Good    Frequency Min 4X/week   Barriers to discharge        Co-evaluation               AM-PAC PT "6 Clicks" Mobility  Outcome Measure Help needed turning from your back to your side while in a flat bed without using bedrails?: None Help needed moving from lying on your back to sitting on the side of a flat bed without using bedrails?: None Help needed  moving to and from a bed to a chair (including a wheelchair)?: A Little Help needed standing up from a chair using your arms (e.g., wheelchair or bedside chair)?: A Little Help needed to walk in hospital room?: A Little Help needed climbing 3-5 steps with a railing? : A Little 6 Click Score: 20    End of Session Equipment Utilized During Treatment: Gait belt Activity Tolerance: Patient tolerated treatment well Patient left: in chair;with call bell/phone within reach;with chair alarm set;with family/visitor present Nurse Communication: Mobility status PT Visit Diagnosis: Unsteadiness on feet (R26.81);Muscle weakness (generalized) (M62.81);Ataxic gait (R26.0)    Time: 4332-9518 PT Time Calculation (min) (ACUTE ONLY): 30 min   Charges:   PT Evaluation $PT Eval Low Complexity: 1 Low PT Treatments $Therapeutic Activity: 8-22 mins        Nysir Fergusson A. Dan Humphreys PT, DPT Acute Rehabilitation Services Pager [phone] Office [phone]   Viviann Spare 04/10/2021, 10:11 AM

## 2021-04-10 NOTE — Progress Notes (Signed)
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 chloride 100 mL/hr at 04/10/21 1500     LOS: 1 day       Kathlen Mody, MD [facility]   To contact the attending provider between 7A-7P or the covering provider during after hours  7P-7A, please log into the web site [email or web address] and access using universal [place] password for that web site. If you do not have the password, please call the hospital operator.  04/10/2021, 4:02 PM

## 2021-04-11 ENCOUNTER — Encounter (HOSPITAL_COMMUNITY): Payer: Self-pay | Admitting: Internal Medicine

## 2021-04-11 ENCOUNTER — Inpatient Hospital Stay (HOSPITAL_COMMUNITY): Payer: 59

## 2021-04-11 ENCOUNTER — Other Ambulatory Visit: Payer: Self-pay

## 2021-04-11 LAB — COMPREHENSIVE METABOLIC PANEL
ALT: 13 U/L (ref 0–44)
AST: 16 U/L (ref 15–41)
Albumin: 3.8 g/dL (ref 3.5–5.0)
Alkaline Phosphatase: 51 U/L (ref 38–126)
Anion gap: 7 (ref 5–15)
BUN: 9 mg/dL (ref 6–20)
CO2: 23 mmol/L (ref 22–32)
Calcium: 8.9 mg/dL (ref 8.9–10.3)
Chloride: 105 mmol/L (ref 98–111)
Creatinine, Ser: 0.88 mg/dL (ref 0.44–1.00)
GFR, Estimated: >60 mL/min (ref >60)
Glucose, Bld: 135 mg/dL — ABNORMAL HIGH (ref 70–99)
Potassium: 4.3 mmol/L (ref 3.5–5.1)
Sodium: 135 mmol/L (ref 135–145)
Total Bilirubin: 0.5 mg/dL (ref 0.3–1.2)
Total Protein: 6.7 g/dL (ref 6.5–8.1)

## 2021-04-11 LAB — RAPID URINE DRUG SCREEN, HOSP PERFORMED
Amphetamines: NOT DETECTED
Barbiturates: NOT DETECTED
Benzodiazepines: NOT DETECTED
Cocaine: NOT DETECTED
Opiates: NOT DETECTED
Tetrahydrocannabinol: NOT DETECTED

## 2021-04-11 LAB — CBC
HCT: 39.1 % (ref 36.0–46.0)
Hemoglobin: 13 g/dL (ref 12.0–15.0)
MCH: 31.6 pg (ref 26.0–34.0)
MCHC: 33.2 g/dL (ref 30.0–36.0)
MCV: 95.1 fL (ref 80.0–100.0)
Platelets: 360 10*3/uL (ref 150–400)
RBC: 4.11 MIL/uL (ref 3.87–5.11)
RDW: 12.3 % (ref 11.5–15.5)
WBC: 14.2 10*3/uL — ABNORMAL HIGH (ref 4.0–10.5)
nRBC: 0 % (ref 0.0–0.2)

## 2021-04-11 IMAGING — CT CT HEAD W/O CM
4 series · 17 of 47 positions shown, 19 images · non-contrast
Comparison: Two days ago

CLINICAL DATA: Stroke follow-up

EXAM:
CT HEAD WITHOUT CONTRAST
TECHNIQUE: Contiguous axial images were obtained from the base of the skull
through the vertex without intravenous contrast.

[Series 3: head without · axial · non-contrast · 0.39mm/px · z∈[-43,+67]mm · 7 of 30 slices shown, 9 images]
[im 4/30  brain]
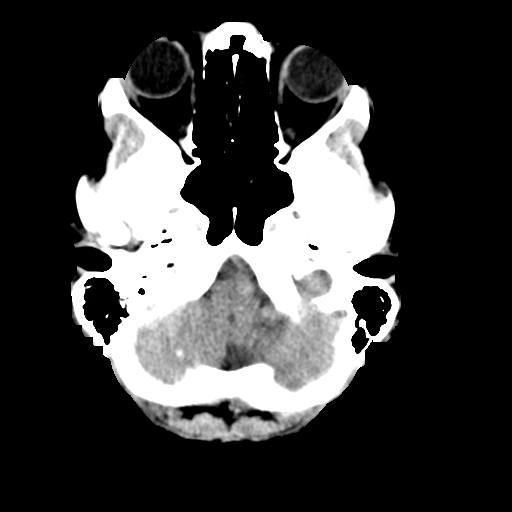
[im 4/30  bone]
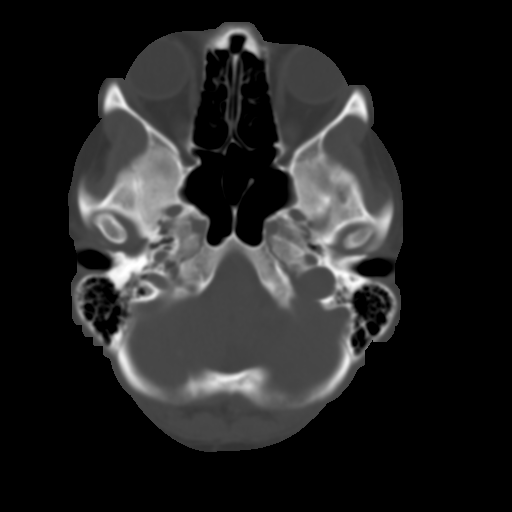
[im 8/30  brain]
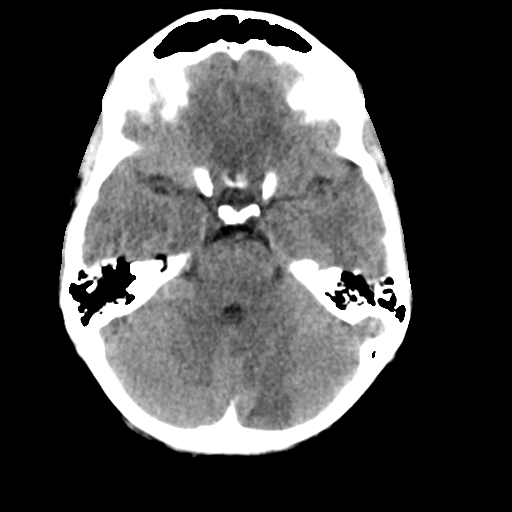
[im 11/30  brain]
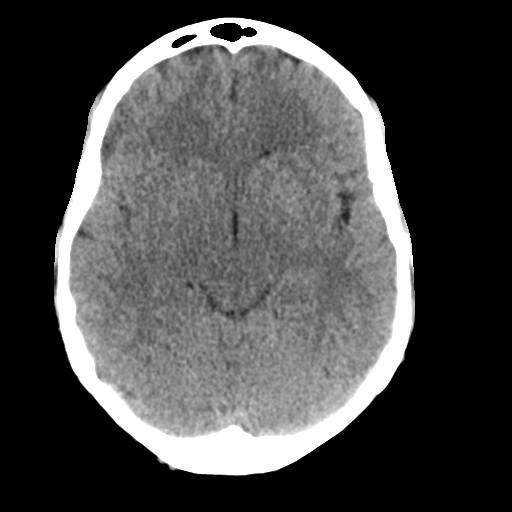
[im 15/30  brain]
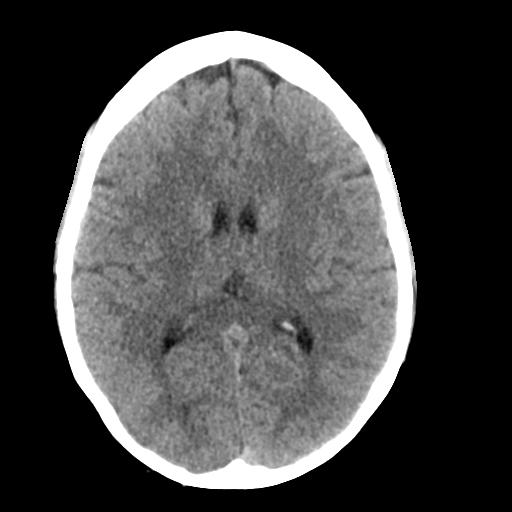
[im 19/30  brain]
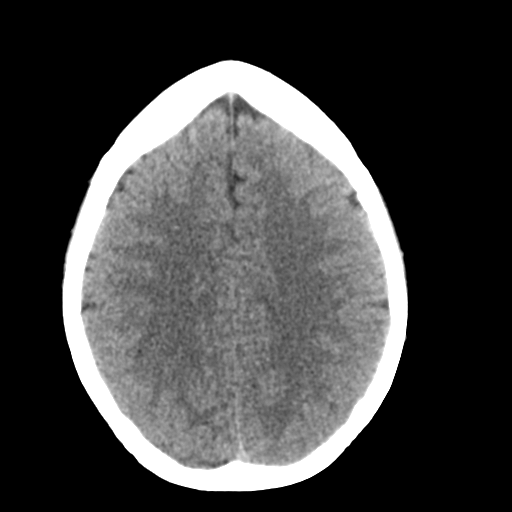
[im 19/30  bone]
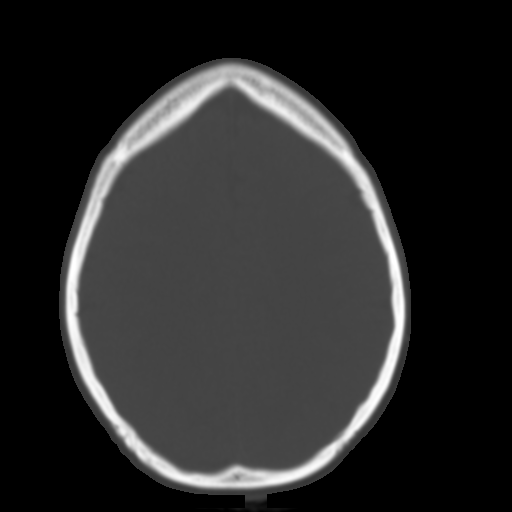
[im 22/30  brain]
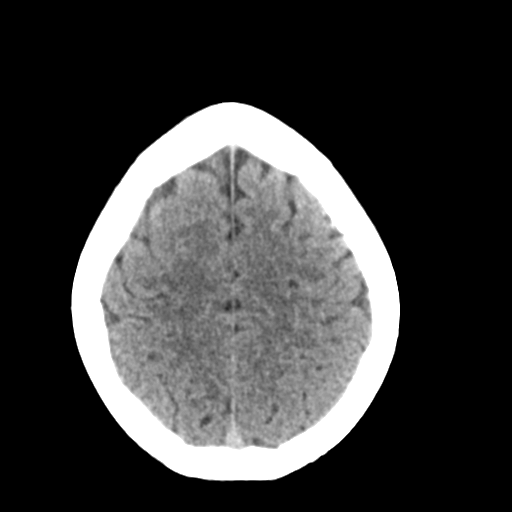
[im 26/30  brain]
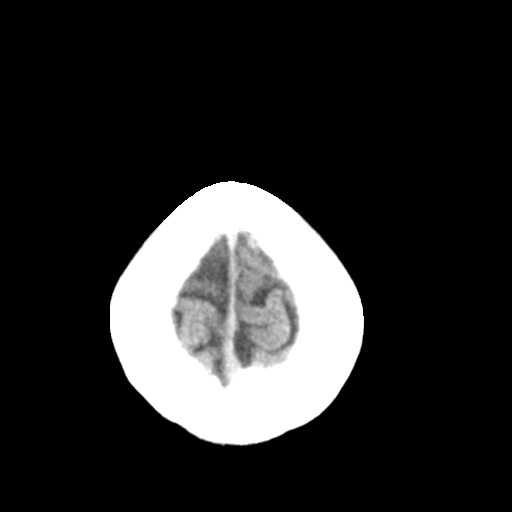

[Series 4: head bone · axial · 0.39mm/px · z∈[-44,+8]mm · 4 of 75 slices shown]
[im 8/75  bone]
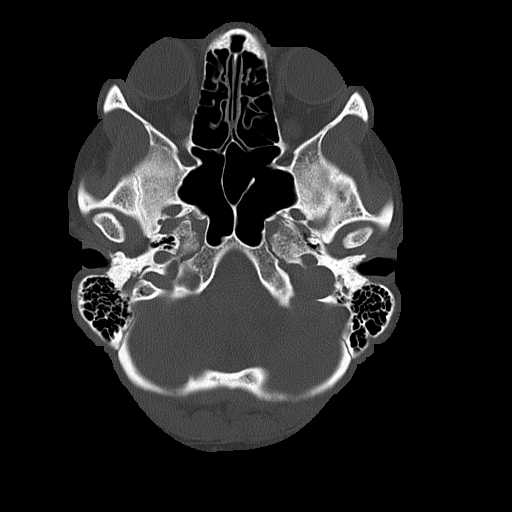
[im 15/75  bone]
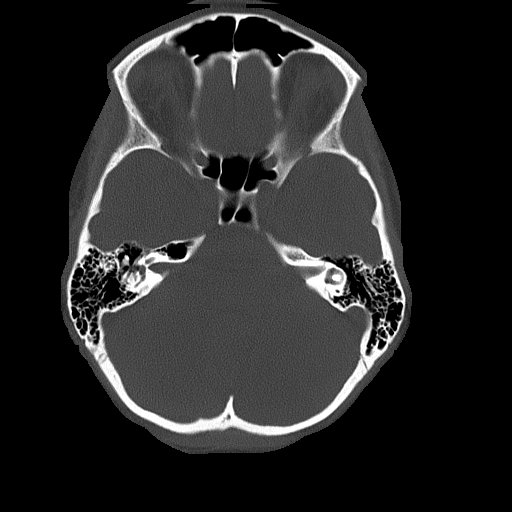
[im 23/75  bone]
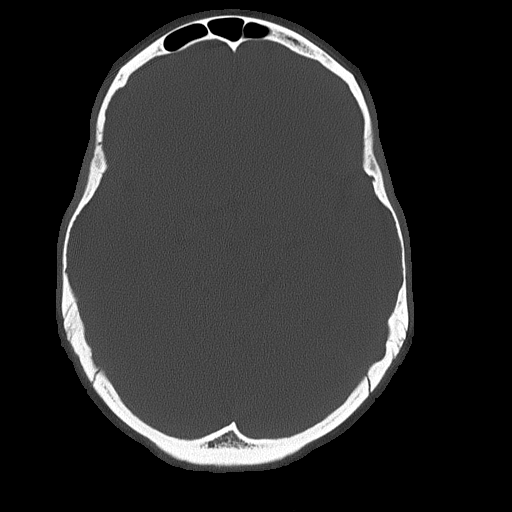
[im 34/75  bone]
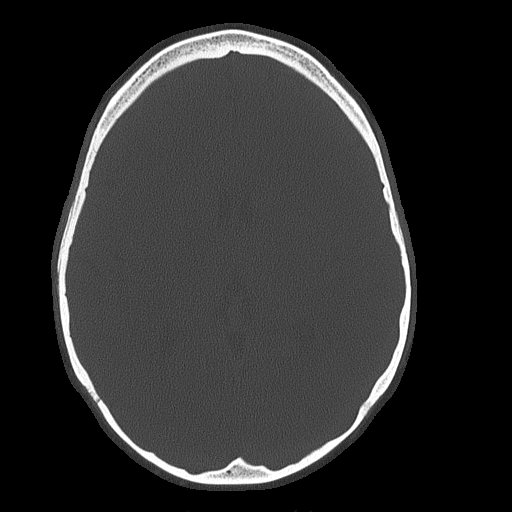

[Series 5: head without cor · coronal · non-contrast · 0.30mm/px · 3 of 66 slices shown]
[im 22/66  brain]
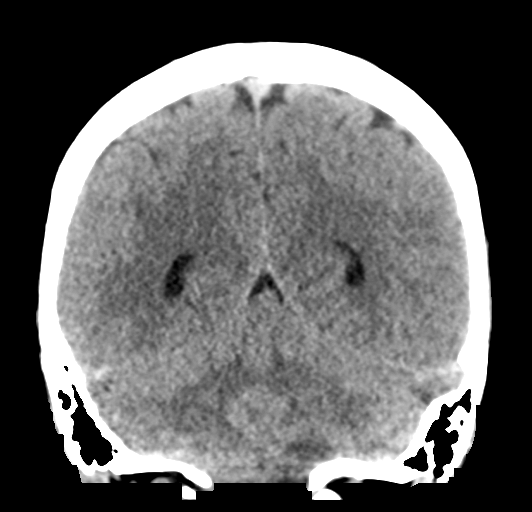
[im 29/66  brain]
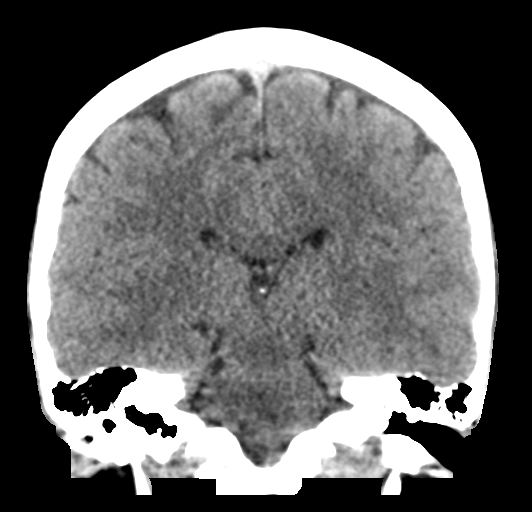
[im 37/66  brain]
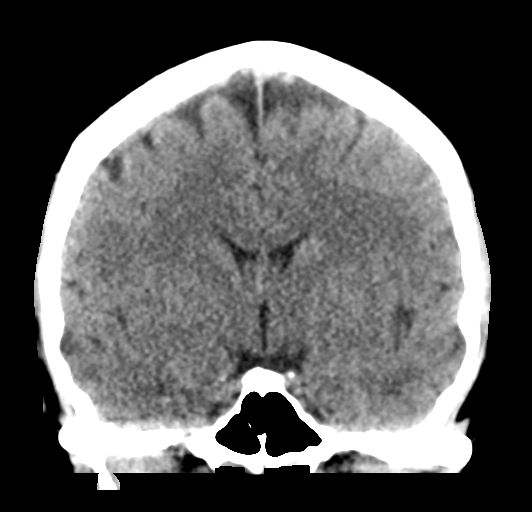

[Series 6: head without sag · sagittal · non-contrast · 0.29mm/px · 3 of 54 slices shown]
[im 18/54  brain]
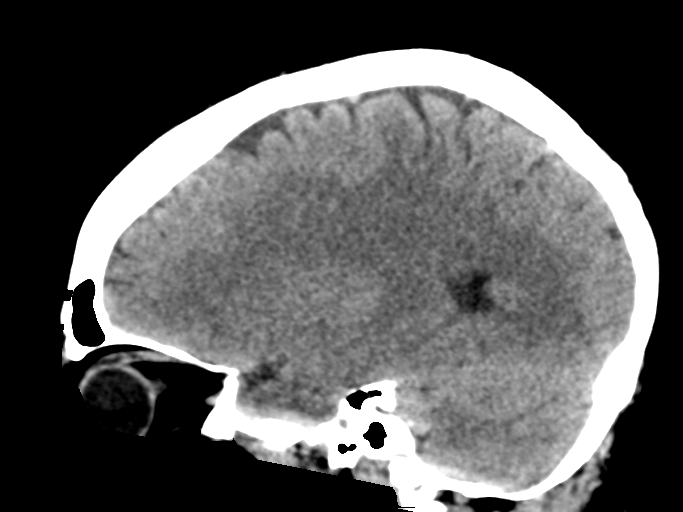
[im 27/54  brain]
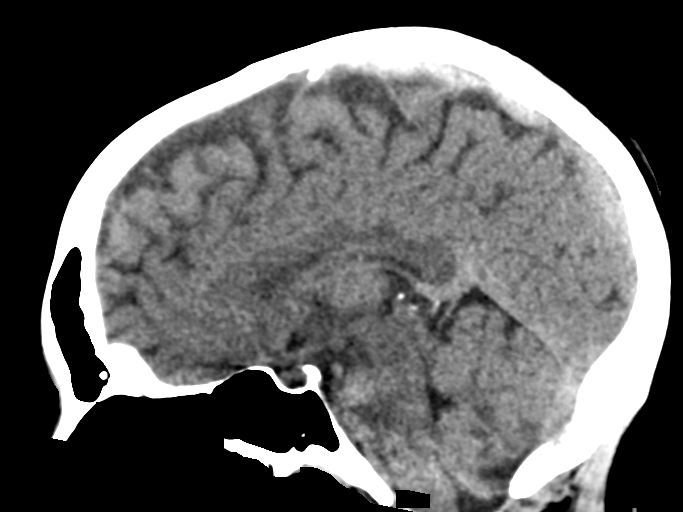
[im 36/54  brain]
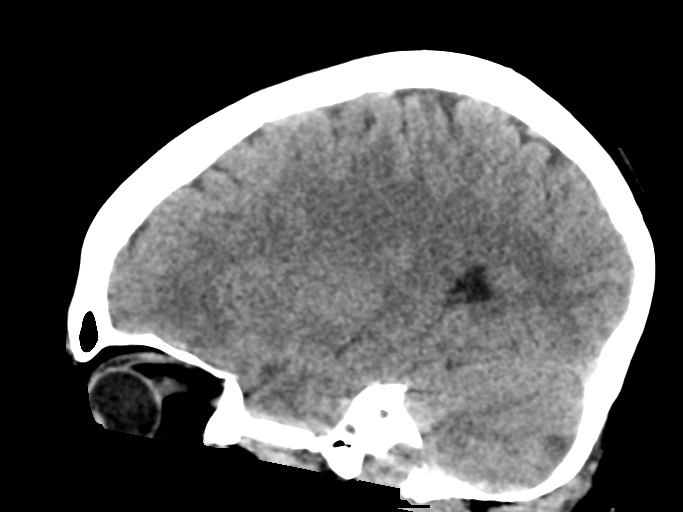

[17 of 47 positions shown; findings below may reference images not displayed]

FINDINGS: Brain: Unchanged infarct in the left cerebellum inferiorly and
medially. Patent fourth ventricle. No evidence of new infarct or
acute hemorrhage. No hydrocephalus or masslike finding.

Vascular: Negative

Skull: Negative

Sinuses/Orbits: Negative
IMPRESSION: No progression or hemorrhage at the left cerebellar infarct. The
fourth ventricle is patent.

## 2021-04-11 MED ORDER — SENNOSIDES-DOCUSATE SODIUM 8.6-50 MG PO TABS: 1.0000 | ORAL_TABLET | Freq: Every evening | ORAL | Status: DC | PRN

## 2021-04-11 MED ORDER — ASPIRIN 81 MG PO TBEC: 81.0000 mg | DELAYED_RELEASE_TABLET | Freq: Every day | ORAL | 11 refills | Status: DC

## 2021-04-11 MED ORDER — TRAMADOL HCL 50 MG PO TABS: 50.0000 mg | ORAL_TABLET | Freq: Four times a day (QID) | ORAL | 0 refills | Status: AC | PRN

## 2021-04-11 MED ORDER — ASPIRIN EC 325 MG PO TBEC: 325.0000 mg | DELAYED_RELEASE_TABLET | Freq: Every day | ORAL | 4 refills | Status: DC

## 2021-04-11 MED ORDER — MECLIZINE HCL 25 MG PO TABS: 25.0000 mg | ORAL_TABLET | Freq: Three times a day (TID) | ORAL | 0 refills | Status: DC | PRN

## 2021-04-11 MED ORDER — CLOPIDOGREL BISULFATE 75 MG PO TABS: 75.0000 mg | ORAL_TABLET | Freq: Every day | ORAL | 0 refills | Status: DC

## 2021-04-11 MED ORDER — ATORVASTATIN CALCIUM 40 MG PO TABS: 40.0000 mg | ORAL_TABLET | Freq: Every day | ORAL | 3 refills | Status: DC

## 2021-04-11 NOTE — Discharge Summary (Signed)
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 VENTRICLE PLAX 2D LVIDd:         4.10 cm     Diastology LVIDs:         2.60 cm     LV e' medial:    9.90 cm/s LV PW:         1.10 cm     LV E/e' medial:  7.4 LV IVS:        1.00 cm     LV e' lateral:   12.30 cm/s LVOT diam:     2.10 cm     LV E/e' lateral: 6.0 LV SV:         69 LV SV Index:   41 LVOT Area:     3.46 cm  LV Volumes (MOD) LV vol d, MOD A2C: 81.1 ml LV vol d, MOD A4C: 85.6 ml LV vol s, MOD A2C: 31.2 ml LV vol s, MOD A4C: 33.6 ml LV SV MOD A2C:     49.9 ml LV SV MOD A4C:     85.6 ml LV SV MOD BP:      50.3 ml RIGHT VENTRICLE             IVC RV Basal diam:  2.40 cm     IVC diam: 1.30 cm RV S prime:     13.20 cm/s TAPSE (M-mode): 2.3 cm LEFT ATRIUM             Index       RIGHT ATRIUM          Index LA diam:        3.10 cm 1.84 cm/m  RA Area:     9.54 cm LA Vol (A2C):   26.7 ml 15.85 ml/m RA Volume:   17.10 ml 10.15 ml/m LA Vol (A4C):   24.1 ml 14.31 ml/m LA Biplane Vol: 25.7 ml 15.26 ml/m  AORTIC VALVE AV Area (Vmax):    3.40 cm AV Area (Vmean):   3.17 cm AV Area (VTI):     3.41 cm AV Vmax:           103.00 cm/s AV Vmean:          72.000 cm/s AV VTI:  0.202 m AV Peak Grad:      4.2 mmHg AV Mean Grad:       2.0 mmHg LVOT Vmax:         101.00 cm/s LVOT Vmean:        66.000 cm/s LVOT VTI:          0.199 m LVOT/AV VTI ratio: 0.99  AORTA Ao Root diam: 3.00 cm Ao Asc diam:  2.70 cm MITRAL VALVE MV Area (PHT): 3.99 cm    SHUNTS MV Decel Time: 190 msec    Systemic VTI:  0.20 m MV E velocity: 73.30 cm/s  Systemic Diam: 2.10 cm MV A velocity: 43.30 cm/s MV E/A ratio:  1.69 Charlton Haws MD Electronically signed by Charlton Haws MD Signature Date/Time: 04/10/2021/3:36:48 PM    Final        Subjective: No new complaints.   Discharge Exam: Vitals:   04/11/21 0738 04/11/21 1155  BP: 106/70 122/80  Pulse: 83 75  Resp: 20 20  Temp: 99.5 F (37.5 C) 99.8 F (37.7 C)  SpO2: 100% 99%   Vitals:   04/10/21 2349 04/11/21 0404 04/11/21 0738 04/11/21 1155  BP: 126/82 112/71 106/70 122/80  Pulse: 84 78 83 75  Resp: Temp: 99.6 F (37.6 C) 99.1 F (37.3 C) 99.5 F (37.5 C) 99.8 F (37.7 C)  TempSrc: Oral Oral Oral Oral  SpO2: 99% 100% 100% 99%  Weight:      Height:        General: Pt is alert, awake, not in acute distress Cardiovascular: RRR, S1/S2 +, no rubs, no gallops Respiratory: CTA bilaterally, no wheezing, no rhonchi Abdominal: Soft, NT, ND, bowel sounds + Extremities: no edema, no cyanosis    The results of significant diagnostics from this hospitalization (including imaging, microbiology, ancillary and laboratory) are listed below for reference.     Microbiology: Recent Results (from the past 240 hour(s))  SARS CORONAVIRUS 2 (TAT 6-24 HRS) Nasopharyngeal Nasopharyngeal Swab     Status: None   Collection Time: 04/09/21  8:13 PM   Specimen: Nasopharyngeal Swab  Result Value Ref Range Status   SARS Coronavirus 2 NEGATIVE NEGATIVE Final    Comment: (NOTE) SARS-CoV-2 target nucleic acids are NOT DETECTED.  The SARS-CoV-2 RNA is generally detectable in upper and lower respiratory specimens during the acute phase of infection. Negative results do not preclude SARS-CoV-2  infection, do not rule out co-infections with other pathogens, and should not be used as the sole basis for treatment or other patient management decisions. Negative results must be combined with clinical observations, patient history, and epidemiological information. The expected result is Negative.  Fact Sheet for Patients: HairSlick.no  Fact Sheet for Healthcare Providers: quierodirigir.com  This test is not yet approved or cleared by the [place] FDA and  has been authorized for detection and/or diagnosis of SARS-CoV-2 by FDA under an Emergency Use Authorization (EUA). This EUA will remain  in effect (meaning this test can be used) for the duration of the COVID-19 declaration under Se ction 564(b)(1) of the Act, 21 U.S.C. section 360bbb-3(b)(1), unless the authorization is terminated or revoked sooner.  Performed at [facility], [address] [place], [place] [address]      Labs: BNP (last 3 results) No results for input(s): BNP in the last 8760 hours. Basic Metabolic Panel: Recent Labs  Lab 04/09/21 1104 04/11/21 1003  NA 138 135  K 3.7 4.3  CL 103 105  CO2 24  23  GLUCOSE 87 135*  BUN 11 9  CREATININE 0.88 0.88  CALCIUM 9.7 8.9   Liver Function Tests: Recent Labs  Lab 04/09/21 1104 04/11/21 1003  AST 14* 16  ALT 14 13  ALKPHOS 51 51  BILITOT 0.7 0.5  PROT 7.5 6.7  ALBUMIN 4.4 3.8   No results for input(s): LIPASE, AMYLASE in the last 168 hours. No results for input(s): AMMONIA in the last 168 hours. CBC: Recent Labs  Lab 04/09/21 1104 04/11/21 1003  WBC 13.2* 14.2*  NEUTROABS 8.5*  --   HGB 14.3 13.0  HCT 44.6 39.1  MCV 96.5 95.1  PLT 449* 360   Cardiac Enzymes: No results for input(s): CKTOTAL, CKMB, CKMBINDEX, TROPONINI in the last 168 hours. BNP: Invalid input(s): POCBNP CBG: No results for input(s): GLUCAP in the last 168 hours. D-Dimer No results for input(s):  DDIMER in the last 72 hours. Hgb A1c Recent Labs    04/10/21 0244  HGBA1C 5.2   Lipid Profile Recent Labs    04/10/21 0244  CHOL 185  HDL 44  LDLCALC 122*  TRIG 97  CHOLHDL 4.2   Thyroid function studies No results for input(s): TSH, T4TOTAL, T3FREE, THYROIDAB in the last 72 hours.  Invalid input(s): FREET3 Anemia work up No results for input(s): VITAMINB12, FOLATE, FERRITIN, TIBC, IRON, RETICCTPCT in the last 72 hours. Urinalysis    Component Value Date/Time   COLORURINE STRAW (A) 04/09/2021 1820   APPEARANCEUR HAZY (A) 04/09/2021 1820   LABSPEC 1.030 04/09/2021 1820   PHURINE 7.0 04/09/2021 1820   GLUCOSEU NEGATIVE 04/09/2021 1820   HGBUR NEGATIVE 04/09/2021 1820   BILIRUBINUR NEGATIVE 04/09/2021 1820   KETONESUR 20 (A) 04/09/2021 1820   PROTEINUR NEGATIVE 04/09/2021 1820   NITRITE NEGATIVE 04/09/2021 1820   LEUKOCYTESUR NEGATIVE 04/09/2021 1820   Sepsis Labs Invalid input(s): PROCALCITONIN,  WBC,  LACTICIDVEN Microbiology Recent Results (from the past 240 hour(s))  SARS CORONAVIRUS 2 (TAT 6-24 HRS) Nasopharyngeal Nasopharyngeal Swab     Status: None   Collection Time: 04/09/21  8:13 PM   Specimen: Nasopharyngeal Swab  Result Value Ref Range Status   SARS Coronavirus 2 NEGATIVE NEGATIVE Final    Comment: (NOTE) SARS-CoV-2 target nucleic acids are NOT DETECTED.  The SARS-CoV-2 RNA is generally detectable in upper and lower respiratory specimens during the acute phase of infection. Negative results do not preclude SARS-CoV-2 infection, do not rule out co-infections with other pathogens, and should not be used as the sole basis for treatment or other patient management decisions. Negative results must be combined with clinical observations, patient history, and epidemiological information. The expected result is Negative.  Fact Sheet for Patients: HairSlick.no  Fact Sheet for Healthcare  Providers: quierodirigir.com  This test is not yet approved or cleared by the [place] FDA and  has been authorized for detection and/or diagnosis of SARS-CoV-2 by FDA under an Emergency Use Authorization (EUA). This EUA will remain  in effect (meaning this test can be used) for the duration of the COVID-19 declaration under Se ction 564(b)(1) of the Act, 21 U.S.C. section 360bbb-3(b)(1), unless the authorization is terminated or revoked sooner.  Performed at [facility], [address], [place], [place] [address]      Time coordinating discharge: 32 minutes.   SIGNED:   Kathlen Mody, MD  [facility] 04/11/2021, 4:01 PM

## 2021-04-11 NOTE — Plan of Care (Signed)
Problem: Education: Goal: Knowledge of General Education information will improve Description: Including pain rating scale, medication(s)/side effects and non-pharmacologic comfort measures 04/11/2021 1546 by Thom Chimes, RN Outcome: Adequate for Discharge 04/11/2021 1112 by Thom Chimes, RN Outcome: Progressing   Problem: Health Behavior/Discharge Planning: Goal: Ability to manage health-related needs will improve 04/11/2021 1546 by Thom Chimes, RN Outcome: Adequate for Discharge 04/11/2021 1112 by Thom Chimes, RN Outcome: Progressing   Problem: Clinical Measurements: Goal: Ability to maintain clinical measurements within normal limits will improve 04/11/2021 1546 by Thom Chimes, RN Outcome: Adequate for Discharge 04/11/2021 1112 by Thom Chimes, RN Outcome: Progressing Goal: Will remain free from infection 04/11/2021 1546 by Thom Chimes, RN Outcome: Adequate for Discharge 04/11/2021 1112 by Thom Chimes, RN Outcome: Progressing Goal: Diagnostic test results will improve 04/11/2021 1546 by Thom Chimes, RN Outcome: Adequate for Discharge 04/11/2021 1112 by Thom Chimes, RN Outcome: Progressing Goal: Respiratory complications will improve 04/11/2021 1546 by Thom Chimes, RN Outcome: Adequate for Discharge 04/11/2021 1112 by Thom Chimes, RN Outcome: Progressing Goal: Cardiovascular complication will be avoided 04/11/2021 1546 by Thom Chimes, RN Outcome: Adequate for Discharge 04/11/2021 1112 by Thom Chimes, RN Outcome: Progressing   Problem: Activity: Goal: Risk for activity intolerance will decrease 04/11/2021 1546 by Thom Chimes, RN Outcome: Adequate for Discharge 04/11/2021 1112 by Thom Chimes, RN Outcome: Progressing   Problem: Nutrition: Goal: Adequate nutrition will be maintained 04/11/2021 1546 by Thom Chimes, RN Outcome: Adequate for Discharge 04/11/2021 1112 by Thom Chimes, RN Outcome:  Progressing   Problem: Coping: Goal: Level of anxiety will decrease 04/11/2021 1546 by Thom Chimes, RN Outcome: Adequate for Discharge 04/11/2021 1112 by Thom Chimes, RN Outcome: Progressing   Problem: Elimination: Goal: Will not experience complications related to bowel motility 04/11/2021 1546 by Thom Chimes, RN Outcome: Adequate for Discharge 04/11/2021 1112 by Thom Chimes, RN Outcome: Progressing Goal: Will not experience complications related to urinary retention 04/11/2021 1546 by Thom Chimes, RN Outcome: Adequate for Discharge 04/11/2021 1112 by Thom Chimes, RN Outcome: Progressing   Problem: Pain Managment: Goal: General experience of comfort will improve 04/11/2021 1546 by Thom Chimes, RN Outcome: Adequate for Discharge 04/11/2021 1112 by Thom Chimes, RN Outcome: Progressing   Problem: Safety: Goal: Ability to remain free from injury will improve 04/11/2021 1546 by Thom Chimes, RN Outcome: Adequate for Discharge 04/11/2021 1112 by Thom Chimes, RN Outcome: Progressing   Problem: Skin Integrity: Goal: Risk for impaired skin integrity will decrease 04/11/2021 1546 by Thom Chimes, RN Outcome: Adequate for Discharge 04/11/2021 1112 by Thom Chimes, RN Outcome: Progressing   Problem: Education: Goal: Knowledge of disease or condition will improve 04/11/2021 1546 by Thom Chimes, RN Outcome: Adequate for Discharge 04/11/2021 1112 by Thom Chimes, RN Outcome: Progressing Goal: Knowledge of secondary prevention will improve 04/11/2021 1546 by Thom Chimes, RN Outcome: Adequate for Discharge 04/11/2021 1112 by Thom Chimes, RN Outcome: Progressing Goal: Knowledge of patient specific risk factors addressed and post discharge goals established will improve 04/11/2021 1546 by Thom Chimes, RN Outcome: Adequate for Discharge 04/11/2021 1112 by Thom Chimes, RN Outcome: Progressing Goal:  Individualized Educational Video(s) 04/11/2021 1546 by Thom Chimes, RN Outcome: Adequate for Discharge 04/11/2021 1112 by Thom Chimes, RN Outcome: Progressing   Problem: Ischemic Stroke/TIA Tissue Perfusion: Goal: Complications of ischemic stroke/TIA will be minimized  04/11/2021 1546 by Thom Chimes, RN Outcome: Adequate for Discharge 04/11/2021 1112 by Thom Chimes, RN Outcome: Progressing

## 2021-04-11 NOTE — Plan of Care (Signed)
  Problem: Education: Goal: Knowledge of General Education information will improve Description Including pain rating scale, medication(s)/side effects and non-pharmacologic comfort measures Outcome: Progressing   Problem: Health Behavior/Discharge Planning: Goal: Ability to manage health-related needs will improve Outcome: Progressing   Problem: Clinical Measurements: Goal: Ability to maintain clinical measurements within normal limits will improve Outcome: Progressing Goal: Will remain free from infection Outcome: Progressing Goal: Diagnostic test results will improve Outcome: Progressing Goal: Respiratory complications will improve Outcome: Progressing Goal: Cardiovascular complication will be avoided Outcome: Progressing   Problem: Activity: Goal: Risk for activity intolerance will decrease Outcome: Progressing   Problem: Nutrition: Goal: Adequate nutrition will be maintained Outcome: Progressing   Problem: Coping: Goal: Level of anxiety will decrease Outcome: Progressing   Problem: Elimination: Goal: Will not experience complications related to bowel motility Outcome: Progressing Goal: Will not experience complications related to urinary retention Outcome: Progressing   Problem: Pain Managment: Goal: General experience of comfort will improve Outcome: Progressing   Problem: Safety: Goal: Ability to remain free from injury will improve Outcome: Progressing   Problem: Skin Integrity: Goal: Risk for impaired skin integrity will decrease Outcome: Progressing   Problem: Education: Goal: Knowledge of disease or condition will improve Outcome: Progressing Goal: Knowledge of secondary prevention will improve Outcome: Progressing Goal: Knowledge of patient specific risk factors addressed and post discharge goals established will improve Outcome: Progressing Goal: Individualized Educational Video(s) Outcome: Progressing   Problem: Ischemic Stroke/TIA Tissue  Perfusion: Goal: Complications of ischemic stroke/TIA will be minimized Outcome: Progressing   

## 2021-04-11 NOTE — Progress Notes (Signed)
SLP Cancellation Note  Patient Details Name: Caroline Graham MRN: [id number] DOB: [birth year]   Cancelled treatment:       Reason Eval/Treat Not Completed: SLP screened. No needs identified, will sign off. Informed pt and family that if changes in speech, language, cognition or swallowing occur to consult SLP services at next level of care.   Avie Echevaria, MA, CCC-SLP Acute Rehabilitation Services Office Number: [phone]  Paulette Blanch 04/11/2021, 11:43 AM

## 2021-04-11 NOTE — Progress Notes (Signed)
Discharged to home after IV access removed and discharge instructions given.  No complaints.  All questions answered.

## 2021-04-11 NOTE — Progress Notes (Signed)
Physical Therapy Treatment Patient Details Name: Caroline Graham MRN: [id number] DOB: Jun 08, 1991 Today's Date: 04/11/2021    History of Present Illness 30 y/o female presented to ED on 4/20 with complaints of L sidedneck pain traveling into head, and dizziness with emesis. Sent to ED by chiropractor on 4/13 for similar complaints and d/c'ed with stretches and medication. 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 CT head & neck - Findings are highly  suspicious for vertebral artery dissection. No significant PMH.    PT Comments    Patient progressing well towards physical therapy goals. Patient overall supervision for OOB mobility with no AD. Patient performed dynamic reaching outside BOS with no UE support and supervision. Continue to recommend OPPT following discharge to address balance and strength deficits.     Follow Up Recommendations  Outpatient PT;Supervision for mobility/OOB     Equipment Recommendations  None recommended by PT    Recommendations for Other Services       Precautions / Restrictions Precautions Precautions: Fall Restrictions Weight Bearing Restrictions: No    Mobility  Bed Mobility Overal bed mobility: Modified Independent                  Transfers Overall transfer level: Needs assistance Equipment used: None Transfers: Sit to/from Stand Sit to Stand: Supervision         General transfer comment: supervision for safety  Ambulation/Gait Ambulation/Gait assistance: Supervision Gait Distance (Feet): 200 Feet Assistive device: None Gait Pattern/deviations: Step-through pattern;Decreased stride length;Drifts right/left Gait velocity: decreased   General Gait Details: more fluid gait this session. Drifts L/R with horizontal and vertical head turns but able to maintain balance   Stairs Stairs: Yes Stairs assistance: Supervision Stair Management: One rail  Left;Alternating pattern;Forwards Number of Stairs: 12 General stair comments: supervision for safety   Wheelchair Mobility    Modified Rankin (Stroke Patients Only) Modified Rankin (Stroke Patients Only) Pre-Morbid Rankin Score: No symptoms Modified Rankin: Moderately severe disability     Balance Overall balance assessment: Mild deficits observed, not formally tested                                          Cognition Arousal/Alertness: Awake/alert Behavior During Therapy: WFL for tasks assessed/performed Overall Cognitive Status: Within Functional Limits for tasks assessed                                        Exercises Other Exercises Other Exercises: dynamic reaching with no UE support    General Comments        Pertinent Vitals/Pain Pain Assessment: 0-10 Pain Score: 2  Pain Location: neck and back of head Pain Descriptors / Indicators: Discomfort;Pressure;Radiating;Sharp Pain Intervention(s): Monitored during session;Repositioned    Home Living                      Prior Function            PT Goals (current goals can now be found in the care plan section) Acute Rehab PT Goals Patient Stated Goal: to get better PT Goal Formulation: With patient Time For Goal Achievement: 04/24/21 Potential to Achieve Goals: Good Progress towards PT goals: Progressing toward goals    Frequency    Min  4X/week      PT Plan Current plan remains appropriate    Co-evaluation              AM-PAC PT "6 Clicks" Mobility   Outcome Measure  Help needed turning from your back to your side while in a flat bed without using bedrails?: None Help needed moving from lying on your back to sitting on the side of a flat bed without using bedrails?: None Help needed moving to and from a bed to a chair (including a wheelchair)?: A Little Help needed standing up from a chair using your arms (e.g., wheelchair or bedside chair)?: A  Little Help needed to walk in hospital room?: A Little Help needed climbing 3-5 steps with a railing? : A Little 6 Click Score: 20    End of Session Equipment Utilized During Treatment: Gait belt Activity Tolerance: Patient tolerated treatment well Patient left: in bed;with call bell/phone within reach;with bed alarm set Nurse Communication: Mobility status PT Visit Diagnosis: Unsteadiness on feet (R26.81);Muscle weakness (generalized) (M62.81);Ataxic gait (R26.0)     Time: 7616-0737 PT Time Calculation (min) (ACUTE ONLY): 16 min  Charges:  $Therapeutic Activity: 8-22 mins                     Sephira Zellman A. Dan Humphreys PT, DPT Acute Rehabilitation Services Pager [phone] Office [phone]    Viviann Spare 04/11/2021, 2:21 PM

## 2021-04-11 NOTE — TOC Transition Note (Signed)
Transition of Care [facility]) - CM/SW Discharge Note   Patient Details  Name: Caroline Graham MRN: [id number] Date of Birth: 27-Aug-1991  [facility]) CM/SW Contact:  Kermit Balo, RN Phone Number: 04/11/2021, 4:28 PM   Clinical Narrative:    Pt discharging home with outpatient therapy through [facility]. Information on the AVS. Pt has transport home.   Final next level of care: OP Rehab Barriers to Discharge: No Barriers Identified   Patient Goals and CMS Choice     Choice offered to / list presented to : Patient  Discharge Placement                       Discharge Plan and Services                                     Social Determinants of Health (SDOH) Interventions     Readmission Risk Interventions No flowsheet data found.

## 2021-04-11 NOTE — Progress Notes (Addendum)
STROKE TEAM PROGRESS NOTE   INTERVAL HISTORY  Patient states improvement of neck pain and headache this morning.  She received Dilaudid and Tramadol x1 today.  CT head obtain yesterday which did not show any progression of the left PICA infarct.    Afebrile. VSS. On exam, patient lying in bed, awake alert orientated x3.  No aphasia, follows simple commands.  Neurological examination intact, no nystagmus, or ataxia or eye disconjugate.  Vitals:   04/10/21 2029 04/10/21 2349 04/11/21 0404 04/11/21 0738  BP: 115/74 126/82 112/71 106/70  Pulse: 84 84 78 83  Resp: 15 20 20 20   Temp: 98.9 F (37.2 C) 99.6 F (37.6 C) 99.1 F (37.3 C) 99.5 F (37.5 C)  TempSrc: Oral Oral Oral Oral  SpO2: 99% 99% 100% 100%  Weight:      Height:       CBC:  Recent Labs  Lab 04/09/21 1104  WBC 13.2*  NEUTROABS 8.5*  HGB 14.3  HCT 44.6  MCV 96.5  PLT 449*   Basic Metabolic Panel:  Recent Labs  Lab 04/09/21 1104  NA 138  K 3.7  CL 103  CO2 24  GLUCOSE 87  BUN 11  CREATININE 0.88  CALCIUM 9.7   Lipid Panel:  Recent Labs  Lab 04/10/21 0244  CHOL 185  TRIG 97  HDL 44  CHOLHDL 4.2  VLDL 19  LDLCALC 04/12/21*   HgbA1c:  Recent Labs  Lab 04/10/21 0244  HGBA1C 5.2   Urine Drug Screen:  Recent Labs  Lab 04/11/21 0123  LABOPIA NONE DETECTED  COCAINSCRNUR NONE DETECTED  LABBENZ NONE DETECTED  AMPHETMU NONE DETECTED  THCU NONE DETECTED  LABBARB NONE DETECTED    Alcohol Level No results for input(s): ETH in the last 168 hours.  IMAGING past 24 hours ECHOCARDIOGRAM COMPLETE  Result Date: 04/10/2021 IMPRESSIONS   1. Left ventricular ejection fraction, by estimation, is 60 to 65%. The left ventricle has normal function. The left ventricle has no regional wall motion abnormalities. Left ventricular diastolic parameters were normal.   2. Right ventricular systolic function is normal. The right ventricular size is normal.   3. The mitral valve is normal in structure. No evidence of  mitral valve regurgitation. No evidence of mitral stenosis.   4. The aortic valve is normal in structure. Aortic valve regurgitation is not visualized. No aortic stenosis is present.   5. The inferior vena cava is normal in size with greater than 50% respiratory variability, suggesting right atrial pressure of 3 mmHg.  PHYSICAL EXAM Neuro: Mental Status: Patient is awake, alert, oriented to person, place, month, year, and situation. Patient is able to give a clear and coherent history. No signs of aphasia or neglect Cranial Nerves: II: Visual Fields are full. Pupils are equal, round, and reactive to light.   III,IV, VI: EOMI without ptosis or diploplia.  No significant nystagmus V: Facial sensation is symmetric to temperature VII: Facial movement is symmetric.  VIII: hearing is intact to voice X: Uvula elevates symmetrically XI: Shoulder shrug is symmetric. XII: tongue is midline without atrophy or fasciculations.  Motor: Tone is normal. Bulk is normal. 5/5 strength was present in all four extremities.  Sensory: Sensation is symmetric to light touch and temperature in the arms and legs. Deep Tendon Reflexes: 2+ and symmetric in the biceps and patellae.  Plantars: Toes are downgoing bilaterally.  Cerebellar: FNF and HKS are intact bilaterally  ASSESSMENT/PLAN [name] is a 30 y.o. female with no significant  PMH presenting with headache, neck pain, and dizziness.    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 right vertebral artery is diminutive and slightly irregular  MRI left PICA infarct  Repeat CT: No progression or hemorrhage at the left cerebellar infarct. The fourth ventricle is patent.  2D Echo: EF 60-65%  LDL 122  HgbA1c 5.2  UDS negative  VTE prophylaxis - SCDs  Diet: heart healthy  No antithrombotic prior to admission, now on aspirin 325 mg daily and clopidogrel  75 mg daily.  Continue DAPT for 3 months and then aspirin 81 alone.  Therapy recommendations:  Outpatient PT  Disposition: home  Bilateral vertebral artery dissection  Patient has neck in extended position for prolonged time prior to symptom onset  CTA head & neck left vertebral artery high grade stenosis at C1 level. right vertebral artery is diminutive and stenosis also at the C1 level.  On DAPT  Repeat CTA head and neck in 2 to 3 months for monitoring  Hyperlipidemia  Home meds:  none  LDL 122, goal < 70  Add Lipitor 40mg  daily  Continue statin at discharge  ? BA tip small aneurysm  CTA head and neck showed slightly bulbous appearance of the basilar artery tip, measuring 3 mm in transverse dimension. A small basilar tip aneurysm cannot be excluded   CTA or MRA follow-up should be considered  Other Active Problems  Headaches: Dilaudid 0.5mg  q4 hr prn, Benadryl and reglan combo, tramadol 50mg  q6hr prn  Dizziness: meclizine  Nausea: zofran  Hospital day # 2  Caroline Graham, ACNP-BC Stroke NP  ATTENDING NOTE: I reviewed above note and agree with the assessment and plan. Pt was seen and examined.   Husband at bedside.  Patient doing well, no headache or other complaints, working with PT/OT, able to walk in the hallway, recommend outpatient PT.  Repeat CT no hydrocephalus.  Okay to discharge from neuro standpoint but discussed with patient and husband in details about watching for symptoms of headache, nausea vomiting, altered mental status.  If any neuro changes, recommend call 911 and come back for evaluation.  Continue aspirin 325 and Plavix 75 for 3 months and then aspirin alone.  Follow-up in clinic to repeat CT head and neck in 2 to 3 months.  Continue Lipitor.  For detailed assessment and plan, please refer to above as I have made changes wherever appropriate.   Neurology will sign off. Please call with questions. Pt will follow up with stroke clinic  NP at [facility] in about 4 weeks. Thanks for the consult.   , MD PhD Stroke Neurology 04/11/2021 5:00 PM      To contact Stroke Continuity provider, please refer to Marvel Plan. After hours, contact General Neurology

## 2021-04-14 ENCOUNTER — Other Ambulatory Visit: Payer: Self-pay | Admitting: *Deleted

## 2021-04-14 NOTE — Patient Outreach (Signed)
[facility]) Care Management  04/14/2021  Zelda Reames [place] November 03, 1991 [id number]   RED ON EMMI ALERT - Stroke Day # 1 Date: 4/24 Red Alert Reason: Not scheduled follow up appointment   Outreach attempt #1, unsuccessful, unable to leave voice message as mailbox is full.     Plan: RN CM will send outreach letter and follow up within the next 3-4 business days.  Kemper Durie, [place], MSN [facility] Care Management  [facility] Manager [phone]

## 2021-04-16 ENCOUNTER — Ambulatory Visit: Payer: 59 | Attending: Internal Medicine | Admitting: Physical Therapy

## 2021-04-16 ENCOUNTER — Encounter: Payer: Self-pay | Admitting: Occupational Therapy

## 2021-04-16 ENCOUNTER — Other Ambulatory Visit: Payer: Self-pay | Admitting: *Deleted

## 2021-04-16 ENCOUNTER — Other Ambulatory Visit: Payer: Self-pay

## 2021-04-16 ENCOUNTER — Ambulatory Visit: Payer: 59 | Admitting: Occupational Therapy

## 2021-04-16 ENCOUNTER — Encounter: Payer: Self-pay | Admitting: Physical Therapy

## 2021-04-16 DIAGNOSIS — I69354 Hemiplegia and hemiparesis following cerebral infarction affecting left non-dominant side: Secondary | ICD-10-CM

## 2021-04-16 DIAGNOSIS — M6281 Muscle weakness (generalized): Secondary | ICD-10-CM

## 2021-04-16 DIAGNOSIS — R41840 Attention and concentration deficit: Secondary | ICD-10-CM | POA: Insufficient documentation

## 2021-04-16 DIAGNOSIS — R2689 Other abnormalities of gait and mobility: Secondary | ICD-10-CM | POA: Insufficient documentation

## 2021-04-16 DIAGNOSIS — R42 Dizziness and giddiness: Secondary | ICD-10-CM | POA: Diagnosis present

## 2021-04-16 DIAGNOSIS — R2681 Unsteadiness on feet: Secondary | ICD-10-CM | POA: Diagnosis not present

## 2021-04-16 DIAGNOSIS — R278 Other lack of coordination: Secondary | ICD-10-CM | POA: Insufficient documentation

## 2021-04-16 NOTE — Patient Outreach (Signed)
[facility]) Care Management  04/16/2021  Caroline Graham 11-07-1991 [id number]   RED ON EMMI ALERT Day #  Date:  Red Alert Reason:   Outreach attempt #2, successful.  Identity verified.  This care manager introduced self and stated purpose of call.  [facility] care management services explained.    She report she does have follow up appointments now scheduled with PCP for 5/5, neurology on 5/17, and for outpatient PT/OT evaluation today.  Denies needing transportation assistance.  She denies feeling worse overall but state she has had some lingering pain, which she was told to expect.  Pain ranges from 4/10 during the day to 8/10 at night.  She is taking Tramadol and Tylenol for pain relief, effective.  Denies any questions about discharge or medication management.  Encouraged to contact this care manager with questions.   Plan: RN CM will not open case at this time, contact information for this care manager mailed to member for future purposes.  Kemper Durie, [place], MSN [facility] Care Management  [facility] Manager [phone]

## 2021-04-16 NOTE — Therapy (Signed)
[facility] [address] [place], [place], [address] Phone: [phone]   Fax:  [phone]  Physical Therapy Evaluation  Patient Details  Name: Caroline Graham MRN: [id number] Date of Birth: 15-Apr-1991 Referring Provider (PT): Ihor Austin(Ohana McCue, NP)   Encounter Date: 04/16/2021   PT End of Session - 04/16/21 1847    Visit Number 1    Number of Visits 13    Authorization Type [employer]-90 combined visits PT, OT, speech    PT Start Time 1535    PT Stop Time 1617    PT Time Calculation (min) 42 min    Activity Tolerance Patient tolerated treatment well    Behavior During Therapy [facility] for tasks assessed/performed           History reviewed. No pertinent past medical history.  Past Surgical History:  Procedure Laterality Date  . KNEE SURGERY    . TONSILLECTOMY      There were no vitals filed for this visit.    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  Diagnosed with a stroke, with some L sided weakness.  D/C home last Friday. Still struggling a little with balance; tend to veer to the side a little.  With more activity, I tend to be off balance.  No falls.  Having less confidence going down stairs, walking up inclines (with inclined driveway-have to hold onto both cars).    Patient Stated Goals Pt's goals for PT are to regain coordination and balance; build more confidence.    Currently in Pain? Yes    Pain Score 2     Pain Location Head    Pain Orientation Posterior    Pain Descriptors / Indicators Throbbing;Aching    Pain Type Acute pain    Pain Onset 1 to 4 weeks ago    Pain Frequency Constant    Aggravating Factors  worse later in the day    Pain Relieving Factors medication    Effect of Pain on Daily Activities PT will monitor pain, but will not address as a goal at this time.              [facility] -  04/16/21 1740      Assessment   Medical Diagnosis cerebellar infarction-L inferioro    Referring Provider (PT) Ihor Austin(Ellaree McCue, NP)    Onset Date/Surgical Date 04/09/21    Hand Dominance Right    Next MD Visit 05/06/2021   to Neurologist     Precautions   Precautions None   Pt not currently driving     Balance Screen   Has the patient fallen in the past 6 months No    Has the patient had a decrease in activity level because of a fear of falling?  No    Is the patient reluctant to leave their home because of a fear of falling?  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, business administration    Leisure ride motorcycles, [employer], travel      Observation/Other Assessments   Focus on Therapeutic Outcomes (FOTO)  Lower extremity intake score 62      Sensation   Light Touch Appears Intact      ROM / Strength   AROM / PROM / Strength Strength      Strength   Overall Strength Comments Grossly tested 5/5 throughout, except 4/5 L hamstrings      Transfers   Transfers Sit to Stand;Stand to Sit    Sit to Stand 6: Modified independent (Device/Increase time);Without upper extremity assist;From chair/3-in-1    Five time sit to stand comments  9    Stand to Sit 6: Modified independent (Device/Increase time);Without upper extremity assist;To chair/3-in-1      Ambulation/Gait   Ambulation/Gait Yes    Ambulation/Gait Assistance 6: Modified independent (Device/Increase time)    Ambulation Distance (Feet) 200 Feet     Assistive device None    Gait Pattern Step-through pattern   Guarded gait, slowed compared to normal   Ambulation Surface Level;Indoor    Gait velocity 11.18 sec = 2.93 ft/sec    Stairs Yes    Stairs Assistance 6: Modified independent (Device/Increase time)    Stair Management Technique Two rails;Alternating pattern    Number of Stairs 4    Height of Stairs 6      Standardized Balance Assessment   Standardized Balance Assessment Timed Up and Go Test      Timed Up and Go Test   Normal TUG (seconds) 9.47      High Level Balance   High Level Balance Comments Pt standing EO and EC on solid surface and on foam x 30 seconds.  On foam with EC, pt noted to have increased sway.  Pt able to stand tandem stance for 30 seconds each position, SLS 10 sec each leg.  Increased sway noted with LLE posterior for tandem and LLE for SLS.      Functional Gait  Assessment   Gait assessed  Yes    Gait Level Surface Walks 20 ft in less than 7 sec but greater than 5.5 sec, uses assistive device, slower speed, mild gait deviations, or deviates 6-10 in outside of the 12 in walkway width.   6.16   Change in Gait Speed Able to smoothly change walking speed without loss of balance or gait deviation. Deviate no more than 6 in outside of the 12 in walkway width.    Gait with Horizontal Head Turns Performs head turns smoothly with slight change in gait velocity (eg, minor disruption to smooth gait path), deviates 6-10 in outside 12 in walkway width, or uses an assistive device.   7.5   Gait with Vertical Head Turns Performs task with slight change in gait velocity (eg, minor disruption to smooth gait path), deviates 6 - 10 in outside 12 in walkway width or uses assistive device   7.03   Gait and Pivot Turn Pivot turns safely within 3 sec and stops quickly with no loss of balance.    Step Over Obstacle Is able to step over one shoe box (4.5 in total height) without changing gait speed. No evidence of imbalance.    Gait  with Narrow Base of Support Is able to ambulate for 10 steps heel to toe with no staggering.    Gait with Eyes Closed Walks 20 ft, slow speed, abnormal  gait pattern, evidence for imbalance, deviates 10-15 in outside 12 in walkway width. Requires more than 9 sec to ambulate 20 ft.   9.84   Ambulating Backwards Walks 20 ft, no assistive devices, good speed, no evidence for imbalance, normal gait    Steps Alternating feet, must use rail.    Total Score 23    FGA comment: Scores <22/30 indicate increased fall risk                  Vestibular Assessment - 04/16/21 0001      Symptom Behavior   Subjective history of current problem unsteadiness, swishiness    Type of Dizziness  Imbalance    Frequency of Dizziness with fatigue, just a baseline feeling most of the time    Aggravating Factors Activity in general;Mornings;Moving eyes;Turning body quickly;Turning head quickly;Turning head sideways;Supine to sit   looking down and then coming back up   Relieving Factors Lying supine;Slow movements    Progression of Symptoms Better              Objective measurements completed on examination: See above findings.               PT Education - 04/16/21 1846    Education Details PT eval results, POC    Person(s) Educated Patient    Methods Explanation    Comprehension Verbalized understanding            PT Short Term Goals - 04/16/21 1854      PT SHORT TERM GOAL #1   Title Pt will be independent with HEP for improved strength, balance, transfers, and gait.  TARGET 05/09/2021    Time 4    Period Weeks    Status New      PT SHORT TERM GOAL #2   Title Pt will improve FGA score to at least 26/30 to decrease fall risk.    Baseline 23/30    Time 4    Period Weeks    Status New      PT SHORT TERM GOAL #3   Title Pt will ambulate at least 10 minutes on level, unlevel surfaces with no increase in reported unsteadiness/vestibular symptoms, for improved activity tolerance  for return to leisure, work activities.    Time 4    Period Weeks    Status New      PT SHORT TERM GOAL #4   Title Pt will negotiate at least 12 steps, alternating pattern, no handrail, independnetly for improved stair negotiation.    Time 4    Period Weeks    Status New             PT Long Term Goals - 04/16/21 1857      PT LONG TERM GOAL #1   Title Pt will be independent with HEP for improved strength, balance, transfers, and gait.  TARGET 06/06/2021    Time 8    Period Weeks    Status New      PT LONG TERM GOAL #2   Title Pt will improve FGA score to at least 28/30 to decrease fall risk.    Time 8    Period Weeks    Status New      PT LONG TERM GOAL #3   Title Pt will perform at least 20 minutes of continuous exercise/activity/gait tasks with no reported increase in vestibular symptoms.    Time 8    Period Weeks    Status New  PT LONG TERM GOAL #4   Title FOTO score for lower extremity to improve to at least 77 for improve overall return to PLOF.    Time 8    Period Weeks    Status New                  Plan - 04/16/21 1848    Clinical Impression Statement Pt is a 30 year old female who presents to OPPT with L sided headache, neck pain, nausea, dizziness; MRI shows acute L inferior cerebellar stroke.  She was hospitalized 04/09/21-04/11/21.  She reports overall feeling of unsteadiness but no falls, veering with gait at times, especially with fatigue.  Pt presents with decreased high level balance, vestibular system deficits, abnormalities of gait, decreased activity level compared to her baseline.  Prior to CVA, pt was independent, working full time and enjoyed riding her motorcycle.  She would benefit from skilled PT to address the above stated deficits to decrease fall risk and improve overall functional mobility and independence.    Personal Factors and Comorbidities Comorbidity 1    Comorbidities hx of knee surgery (L)    Examination-Activity Limitations  Locomotion Level;Transfers;Squat;Carry    Examination-Participation Restrictions Community Activity;Driving    Stability/Clinical Decision Making Stable/Uncomplicated    Clinical Decision Making Low    Rehab Potential Good    PT Frequency Other (comment)   2x/wk for 4 weeks, then 1x/wk for 4 weeks, after eval   PT Duration Other (comment)   total POC = 8 weeks   PT Treatment/Interventions ADLs/Self Care Home Management;Gait training;Stair training;Functional mobility training;Therapeutic activities;Therapeutic exercise;Balance training;Neuromuscular re-education;Patient/family education;Vestibular    PT Next Visit Plan Initiate HEP for high level balance, compliant surface; further vestibular system testing as needed    Consulted and Agree with Plan of Care Patient           Patient will benefit from skilled therapeutic intervention in order to improve the following deficits and impairments:  Abnormal gait,Decreased mobility,Decreased strength,Dizziness,Difficulty walking,Decreased activity tolerance  Visit Diagnosis: Unsteadiness on feet  Other abnormalities of gait and mobility  Dizziness and giddiness     Problem List Patient Active Problem List   Diagnosis Date Noted  . Cerebellar infarct (HCC) 04/09/2021    Aubert Choyce W. 04/16/2021, 7:00 PM Lonia Blood, PT 04/16/21 7:00 PM Phone: [phone] Fax: [phone]   [facility] [address] [place], [place], [address] Phone: [phone]   Fax:  [phone]  Name: Caroline Graham MRN: [id number] Date of Birth: Dec 09, 1991

## 2021-04-17 NOTE — Therapy (Signed)
[facility] [address] [place], [place], [address] Phone: [phone]   Fax:  [phone]  Occupational Therapy Evaluation  Patient Details  Name: Caroline Graham MRN: [id number] Date of Birth: 1991-07-22 Referring Provider (OT): follows up with Ihor Austin, NP   Encounter Date: 04/16/2021   OT End of Session - 04/16/21 1728    Visit Number 1    Number of Visits 9    Date for OT Re-Evaluation 06/11/21    Authorization Type [facility] 2022    Authorization Time Period $30 copay per discipline 90 ST/OT/PT    OT Start Time 1700    OT Stop Time 1730    OT Time Calculation (min) 30 min           History reviewed. No pertinent past medical history.  Past Surgical History:  Procedure Laterality Date  . KNEE SURGERY    . TONSILLECTOMY      There were no vitals filed for this visit.   Subjective Assessment - 04/16/21 1735    Subjective  Pt presents to OT s/p cerebral infarction with goal "to get back to my baseline" and mentioned wanting to "safely use a knife"    Pertinent History PMH: arterial dissection, CVA    Patient Stated Goals to get back to my baseline, safely use knife    Currently in Pain? No/denies             [facility] OT Assessment - 04/16/21 1705      Assessment   Medical Diagnosis cerebellar infarction    Referring Provider (OT) follows up with Ihor Austin, NP    Onset Date/Surgical Date 04/09/21    Hand Dominance Right      Precautions   Precautions None      Balance Screen   Has the patient fallen in the past 6 months No      Home  Environment   Family/patient expects to be discharged to: Private residence    Living Arrangements Spouse/significant other   2 adult dogs, 1 puppy   Available Help at Discharge Family    Type of Home House    Bathroom Shower/Tub Walk-in [employer] None      Prior Function   Level of Independence Independent    Vocation Full time  employment;Student    Building surveyor and gamble as Land, business administration    Leisure ride motorcycles, Financial risk analyst, travel      ADL   Eating/Feeding Independent    Grooming Independent    Product manager Independent    Lower Body Bathing Independent    Upper Body Dressing Independent    Lower Body Dressing Modified independent   d/t balance lean on wall   Lobbyist - Solicitor -  Database administrator Independent      IADL   Prior Level of Function Shopping independent    Shopping Needs to be accompanied on any shopping trip   gets fatigued easily - headaches   Prior Level of Function Light Housekeeping independent    Light Housekeeping Performs light daily tasks but cannot maintain acceptable level of cleanliness   needs assistance - fatigues   Prior Level of Function Meal Prep independent    Meal Prep Able to complete simple warm meal prep   help out   Prior Level of Function Community Mobility drove before  Community Mobility Relies on family or friends for transportation    Prior Level of Function Medication Managment independent    Medication Management Takes responsibility if medication is prepared in advance in seperate dosage    Prior Level of Function Financial Management independent - 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    Financial Management Manages financial matters independently (budgets, writes checks, pays rent, bills goes to bank), collects and keeps track of income      Written Expression   Dominant Hand Right    Handwriting 90% legible      Vision - History   Baseline Vision No visual deficits    Additional Comments pt reports some deficits with focusing now after stroke      Vision Assessment   Tracking/Visual Pursuits Able to track stimulus in all quads without difficulty    Saccades Within functional limits    Convergence Within functional limits     Comment reports visual fatigue with computer screens      Cognition   Overall Cognitive Status Within Functional Limits for tasks assessed    Cognition Comments pt reports some difficulty with focusing on school exams, etc. Will continue to assess in functional context      Observation/Other Assessments   Focus on Therapeutic Outcomes (FOTO)  63 OT      Sensation   Light Touch Appears Intact    Hot/Cold Appears Intact      Coordination   9 Hole Peg Test Right;Left    Right 9 Hole Peg Test 18.69s    Left 9 Hole Peg Test 23.56s    Box and Blocks R 73  L 61   undershooting/hits wall with LUE     ROM / Strength   AROM / PROM / Strength AROM;Strength      AROM   Overall AROM  Within functional limits for tasks performed      Strength   Overall Strength Within functional limits for tasks performed      Hand Function   Right Hand Gross Grasp Functional    Right Hand Grip (lbs) 79.3    Left Hand Gross Grasp Functional    Left Hand Grip (lbs) 59                           OT Education - 04/16/21 1741    Education Details Education provided on role and purpose of OT    Person(s) Educated Patient    Methods Explanation    Comprehension Verbalized understanding            OT Short Term Goals - 04/17/21 1006      OT SHORT TERM GOAL #1   Title Pt will be independent with HEP for grip strength    Time 4    Period Weeks    Status New    Target Date 05/15/21      OT SHORT TERM GOAL #2   Title Pt will verbalize understanding of adapted strategies for increasing safety awareness and independence with ADLs and IADLs (cutting vegetables, fastening bra, etc)    Time 4    Period Weeks    Status New      OT SHORT TERM GOAL #3   Title Pt will increase grip strength in LUE by 5 lbs for in order to increase functional use in non dominant hand.    Baseline R 79.3 L 59    Time 4    Period Weeks    Status  New      OT SHORT TERM GOAL #4   Title Pt will complete  66 blocks or greater in Box and Blocks with LUE without hitting the divider d/t undershooting.    Baseline 61 with L with undershooting approx 5 times.    Time 4    Period Weeks    Status New             OT Long Term Goals - 04/17/21 1008      OT LONG TERM GOAL #1   Title Pt will increase grip strength in LUE by 10 lbs in order to increase functional use in non dominant hand.    Baseline R 79.3, L 59    Time 8    Period Weeks    Status New    Target Date 06/12/21      OT LONG TERM GOAL #2   Title Pt will perform physical and cognitive task simultaneously with 100% accuracy    Time 8    Period Weeks    Status New      OT LONG TERM GOAL #3   Title Pt will verbalize understanding of return to driving recommendations    Time 8    Period Weeks    Status New      OT LONG TERM GOAL #4   Title Pt will perform work simulated tasks with 95% accuracy for prep for return to work.    Time 8    Period Weeks    Status New      OT LONG TERM GOAL #5   Title Pt will complete UE FOTO at discharge.    Time 8    Period Weeks    Status New                 Plan - 04/17/21 1011    Clinical Impression Statement Pt is a 30 year old that presents to Neuro OPOT s/p cerebellar infarction on 04/08/2021. Pt presents with deficits with LUE weakness with grip strength, decreased coordination, some difficulty with focusing with vision and attention and decreased activity tolerance. Skilled occupational therapy is recommended to target listed areas of deficit and increase independence with ADLs and IADLs.    OT Occupational Profile and History Problem Focused Assessment - Including review of records relating to presenting problem    Occupational performance deficits (Please refer to evaluation for details): Work;Leisure;IADL's;ADL's    Body Structure / Function / Physical Skills ADL;IADL;FMC;Vision;UE functional use;Strength;Decreased knowledge of use of DME;Coordination;Dexterity;GMC     Cognitive Skills Attention    Rehab Potential Good    Clinical Decision Making Limited treatment options, no task modification necessary    Comorbidities Affecting Occupational Performance: None    Modification or Assistance to Complete Evaluation  No modification of tasks or assist necessary to complete eval    OT Frequency 1x / week    OT Duration 8 weeks   may d/c early based on progress.   OT Treatment/Interventions Self-care/ADL training;DME and/or AE instruction;Moist Heat;Therapeutic activities;Patient/family education;Neuromuscular education;Functional Mobility Training;Visual/perceptual remediation/compensation;Cognitive remediation/compensation;Therapeutic exercise    Plan coordination and grip strength HEP    Consulted and Agree with Plan of Care Patient           Patient will benefit from skilled therapeutic intervention in order to improve the following deficits and impairments:   Body Structure / Function / Physical Skills: ADL,IADL,FMC,Vision,UE functional use,Strength,Decreased knowledge of use of DME,Coordination,Dexterity,GMC Cognitive Skills: Attention     Visit Diagnosis: Muscle weakness (  generalized) - Plan: Ot plan of care cert/re-cert  Other lack of coordination - Plan: Ot plan of care cert/re-cert  Hemiplegia and hemiparesis following cerebral infarction affecting left non-dominant side (HCC) - Plan: Ot plan of care cert/re-cert  Attention and concentration deficit - Plan: Ot plan of care cert/re-cert    Problem List Patient Active Problem List   Diagnosis Date Noted  . Cerebellar infarct [facility]) 04/09/2021    Junious Dresser MOT, OTR/L  04/17/2021, 10:18 AM  [facility] [address] [place], [place], [address] Phone: [phone]   Fax:  [phone]  Name: Caroline Graham MRN: [id number] Date of Birth: 07-11-1991

## 2021-04-18 ENCOUNTER — Ambulatory Visit: Payer: Self-pay | Admitting: *Deleted

## 2021-04-21 ENCOUNTER — Ambulatory Visit: Payer: 59 | Attending: Internal Medicine | Admitting: Physical Therapy

## 2021-04-21 ENCOUNTER — Other Ambulatory Visit: Payer: Self-pay

## 2021-04-21 ENCOUNTER — Encounter: Payer: Self-pay | Admitting: Physical Therapy

## 2021-04-21 DIAGNOSIS — M6281 Muscle weakness (generalized): Secondary | ICD-10-CM | POA: Insufficient documentation

## 2021-04-21 DIAGNOSIS — I69354 Hemiplegia and hemiparesis following cerebral infarction affecting left non-dominant side: Secondary | ICD-10-CM | POA: Diagnosis present

## 2021-04-21 DIAGNOSIS — R2689 Other abnormalities of gait and mobility: Secondary | ICD-10-CM | POA: Insufficient documentation

## 2021-04-21 DIAGNOSIS — R41840 Attention and concentration deficit: Secondary | ICD-10-CM | POA: Diagnosis present

## 2021-04-21 DIAGNOSIS — R2681 Unsteadiness on feet: Secondary | ICD-10-CM | POA: Insufficient documentation

## 2021-04-21 DIAGNOSIS — R278 Other lack of coordination: Secondary | ICD-10-CM | POA: Diagnosis present

## 2021-04-21 DIAGNOSIS — R42 Dizziness and giddiness: Secondary | ICD-10-CM | POA: Insufficient documentation

## 2021-04-21 NOTE — Patient Instructions (Signed)
Access Code: [email or web address] URL: [email or web address] Date: 04/21/2021 Prepared by: Lonia Blood  Exercises Seated Gaze Stabilization with Head Rotation - 2-3 x daily - 7 x weekly - 1 sets - 5-10 reps Seated Gaze Stabilization with Head Nod - 2-3 x daily - 7 x weekly - 1 sets - 5-10 reps Standing Gaze Stabilization with Head Rotation - 2-3 x daily - 7 x weekly - 1 sets - 5-10 reps Standing Gaze Stabilization with Head Nod - 2-3 x daily - 7 x weekly - 1 sets - 5-10 reps

## 2021-04-21 NOTE — Therapy (Signed)
[facility] [address] [place], [place], [address] Phone: [phone]   Fax:  [phone]  Physical Therapy Treatment  Patient Details  Name: Caroline Graham MRN: [id number] Date of Birth: 1991/02/26 Referring Provider (PT): Ihor Austin, NP)   Encounter Date: 04/21/2021   PT End of Session - 04/21/21 1453    Visit Number 2    Number of Visits 13    Authorization Type [employer] combined visits PT, OT, speech    PT Start Time 1414   Pt arrives late   PT Stop Time 1446    PT Time Calculation (min) 32 min    Activity Tolerance Patient tolerated treatment well   slight increase in symptoms   Behavior During Therapy [facility] for tasks assessed/performed           History reviewed. No pertinent past medical history.  Past Surgical History:  Procedure Laterality Date  . KNEE SURGERY    . TONSILLECTOMY      There were no vitals filed for this visit.   Subjective Assessment - 04/21/21 1412    Subjective No changes, still some pain in the back.  Feel more off-balance than I had (on Thursday); took the Meclizine and then went to sleep.  Looking down and side to side made me dizzy and increased my headache.    Patient Stated Goals Pt's goals for PT are to regain coordination and balance; build more confidence.    Currently in Pain? Yes    Pain Score 1     Pain Location Head    Pain Orientation Posterior    Pain Descriptors / Indicators Aching    Pain Type Acute pain    Pain Onset 1 to 4 weeks ago    Aggravating Factors  worse later in the day/night    Pain Relieving Factors medication                   Vestibular Assessment - 04/21/21 0001      Visual Acuity   Static Line 10    Dynamic Line 10   slight increase in symptoms of unsteadiness upon stopping head motion.                   [facility] Adult PT Treatment/Exercise - 04/21/21 0001      High Level Balance   High Level Balance  Activities Backward walking;Side stepping;Tandem walking    High Level Balance Comments Forward/back walking at counter, x 4 reps, sidestepping x 3 reps, cues to use visual target.  Rates unsteadiness as 3/10.  Subsides in less than 1 minute.  Tandem gait forward/back x 3 reps.  Marching in place x 10, heel/toe raises x 10 reps.  Minisquats x 5 reps, then squats to look at target on ground, then eyes to target at eye level upon return to standing.  Increase in symptoms to 4/10 and then subsides in < 1 minute.           Vestibular Treatment/Exercise - 04/21/21 0001      Vestibular Treatment/Exercise   Vestibular Treatment Provided Gaze    Gaze Exercises X1 Viewing Horizontal;X1 Viewing Vertical      X1 Viewing Horizontal   Foot Position Seated, then standing feet apart    Reps 5    Comments --   In standing, increase from baseline 2/10 rating of symptoms to 3-4/10.  Subsides in 30-60 sec     X1 Viewing Vertical   Foot Position  seated, standing feet apart    Reps 5    Comments Slight increase in symptoms, not as much as side to side                 PT Education - 04/21/21 1453    Education Details Initiated HEP-see instructions    Person(s) Educated Patient    Methods Explanation;Demonstration;Handout;Verbal cues    Comprehension Verbalized understanding;Returned demonstration            PT Short Term Goals - 04/16/21 1854      PT SHORT TERM GOAL #1   Title Pt will be independent with HEP for improved strength, balance, transfers, and gait.  TARGET 05/09/2021    Time 4    Period Weeks    Status New      PT SHORT TERM GOAL #2   Title Pt will improve FGA score to at least 26/30 to decrease fall risk.    Baseline 23/30    Time 4    Period Weeks    Status New      PT SHORT TERM GOAL #3   Title Pt will ambulate at least 10 minutes on level, unlevel surfaces with no increase in reported unsteadiness/vestibular symptoms, for improved activity tolerance for return to  leisure, work activities.    Time 4    Period Weeks    Status New      PT SHORT TERM GOAL #4   Title Pt will negotiate at least 12 steps, alternating pattern, no handrail, independnetly for improved stair negotiation.    Time 4    Period Weeks    Status New             PT Long Term Goals - 04/16/21 1857      PT LONG TERM GOAL #1   Title Pt will be independent with HEP for improved strength, balance, transfers, and gait.  TARGET 06/06/2021    Time 8    Period Weeks    Status New      PT LONG TERM GOAL #2   Title Pt will improve FGA score to at least 28/30 to decrease fall risk.    Time 8    Period Weeks    Status New      PT LONG TERM GOAL #3   Title Pt will perform at least 20 minutes of continuous exercise/activity/gait tasks with no reported increase in vestibular symptoms.    Time 8    Period Weeks    Status New      PT LONG TERM GOAL #4   Title FOTO score for lower extremity to improve to at least 77 for improve overall return to PLOF.    Time 8    Period Weeks    Status New                 Plan - 04/21/21 1454    Clinical Impression Statement Initiated HEP today, for gaze stabilization.  Looked at visual acuity in standing, with pt able to read line 10 with head stationary and head moving, but increased symptoms upon stopping head motion.  Pt generally rates her unsteadiness symtpoms as 2/10 baseline and increased to 3-4/10 with head motions during session; symptoms resume to baseline within 30-60 seconds.  Will continue to benefit from further skilled PT to address balance, vestibular systems to increase activity tolerance and functional mobility.    Personal Factors and Comorbidities Comorbidity 1    Comorbidities hx of knee surgery (L)  Examination-Activity Limitations Locomotion Level;Transfers;Squat;Carry    Examination-Participation Restrictions Community Activity;Driving    Stability/Clinical Decision Making Stable/Uncomplicated    Rehab  Potential Good    PT Frequency Other (comment)   2x/wk for 4 weeks, then 1x/wk for 4 weeks, after eval   PT Duration Other (comment)   total POC = 8 weeks   PT Treatment/Interventions ADLs/Self Care Home Management;Gait training;Stair training;Functional mobility training;Therapeutic activities;Therapeutic exercise;Balance training;Neuromuscular re-education;Patient/family education;Vestibular    PT Next Visit Plan Review initial HEP and progress as needed, including high level balance, compliant surface, gentle head and body motions; further vestibular system testing as needed    Consulted and Agree with Plan of Care Patient           Patient will benefit from skilled therapeutic intervention in order to improve the following deficits and impairments:  Abnormal gait,Decreased mobility,Decreased strength,Dizziness,Difficulty walking,Decreased activity tolerance  Visit Diagnosis: Unsteadiness on feet  Dizziness and giddiness  Other abnormalities of gait and mobility     Problem List Patient Active Problem List   Diagnosis Date Noted  . Cerebellar infarct (HCC) 04/09/2021    Calypso Hagarty W. 04/21/2021, 2:57 PM Gean Maidens., PT [facility] [address] [place], [place], [address] Phone: [phone]   Fax:  [phone]  Name: Caroline Graham MRN: [id number] Date of Birth: Nov 05, 1991

## 2021-04-23 ENCOUNTER — Encounter: Payer: Self-pay | Admitting: Occupational Therapy

## 2021-04-23 ENCOUNTER — Ambulatory Visit: Payer: 59 | Admitting: Occupational Therapy

## 2021-04-23 ENCOUNTER — Other Ambulatory Visit: Payer: Self-pay

## 2021-04-23 ENCOUNTER — Encounter: Payer: Self-pay | Admitting: Physical Therapy

## 2021-04-23 ENCOUNTER — Ambulatory Visit: Payer: 59 | Admitting: Physical Therapy

## 2021-04-23 DIAGNOSIS — M6281 Muscle weakness (generalized): Secondary | ICD-10-CM

## 2021-04-23 DIAGNOSIS — R2681 Unsteadiness on feet: Secondary | ICD-10-CM

## 2021-04-23 DIAGNOSIS — I69354 Hemiplegia and hemiparesis following cerebral infarction affecting left non-dominant side: Secondary | ICD-10-CM

## 2021-04-23 DIAGNOSIS — R42 Dizziness and giddiness: Secondary | ICD-10-CM

## 2021-04-23 DIAGNOSIS — R41840 Attention and concentration deficit: Secondary | ICD-10-CM

## 2021-04-23 DIAGNOSIS — R2689 Other abnormalities of gait and mobility: Secondary | ICD-10-CM

## 2021-04-23 DIAGNOSIS — R278 Other lack of coordination: Secondary | ICD-10-CM

## 2021-04-23 NOTE — Patient Instructions (Signed)
Access Code: [email or web address] URL: [email or web address] Date: 04/23/2021 Prepared by: Kallie Edward  Exercises Putty Squeezes - 1 x daily - 7 x weekly - 3 sets - 10 reps Rolling Putty on Table - 1 x daily - 7 x weekly - 3 sets - 10 reps Thumb Opposition with Putty - 1 x daily - 7 x weekly - 3 sets - 10 reps Seated Finger MP Flexion with Putty - 1 x daily - 7 x weekly - 3 sets - 10 reps Finger Extension with Putty - 1 x daily - 7 x weekly - 3 sets - 10 reps

## 2021-04-23 NOTE — Patient Instructions (Signed)
Caroline Graham Exercise  Sit on the side of a bed. Turn your head 45 degrees to the LEFT. Hold this rotated neck position and then lie down on your RIGHT side. Stay on your side until symptoms go away, then return to the seated position again while maintaining your neck rotated in the same position.  Look ahead until your symptoms resolve.  Then turn your head 45 degrees to the RIGHT. Hold this rotated neck position and then lie down on your LEFT side. Stay on your side until symptoms resolve, then return to the seated position again.  Repeat this for 3-4 times toward each side. Do this daily.  Video # [id number]

## 2021-04-23 NOTE — Therapy (Signed)
[facility] [address] [place], [place], [address] Phone: [phone]   Fax:  [phone]  Occupational Therapy Treatment  Patient Details  Name: Caroline Graham MRN: [id number] Date of Birth: 05-16-1991 Referring Provider (OT): follows up with Ihor Austin, NP   Encounter Date: 04/23/2021   OT End of Session - 04/23/21 1616    Visit Number 2    Number of Visits 9    Date for OT Re-Evaluation 06/11/21    Authorization Type [facility] 2022    Authorization Time Period $30 copay per discipline 90 ST/OT/PT    OT Start Time 1616    OT Stop Time 1657    OT Time Calculation (min) 41 min    Activity Tolerance Patient tolerated treatment well    Behavior During Therapy [facility] for tasks assessed/performed           History reviewed. No pertinent past medical history.  Past Surgical History:  Procedure Laterality Date  . KNEE SURGERY    . TONSILLECTOMY      There were no vitals filed for this visit.   Subjective Assessment - 04/23/21 1617    Subjective  Pt reports doing good with minimal pain. I've gone out in public and it's been a lot. I would like to get back to golf.    Pertinent History PMH: arterial dissection, CVA    Patient Stated Goals to get back to my baseline, safely use knife    Currently in Pain? Yes    Pain Score 2     Pain Location Head    Pain Orientation Upper    Pain Descriptors / Indicators Aching    Pain Type Acute pain    Pain Onset 1 to 4 weeks ago    Pain Frequency Constant                 Hand Gripper: with LUE on level 4 with gold spring. Pt picked up 1 inch blocks with gripper with min drops and min difficulty.  ADLs simulated cutting fruits and vegetables with theraputty and steak knife with no safety concerns. Pt was slow and steady with cutting.   Gross Motor Coordination with tossing ball in one hand, between 2 hands, juggling, bouncy balls with bouncing/catching,  bouncing twice and catching, bouncing and catching with other hand, dribbling, and dribbling and catching. Pt with min difficulty with coordination tasks.             OT Education - 04/23/21 1627    Education Details Issued red theraputty - Access Code: [email or web address]    Person(s) Educated Patient    Methods Explanation;Demonstration;Handout    Comprehension Verbalized understanding;Returned demonstration            OT Short Term Goals - 04/23/21 1635      OT SHORT TERM GOAL #1   Title Pt will be independent with HEP for grip strength    Time 4    Period Weeks    Status On-going   issued red theraputty 04/23/21   Target Date 05/15/21      OT SHORT TERM GOAL #2   Title Pt will verbalize understanding of adapted strategies for increasing safety awareness and independence with ADLs and IADLs (cutting vegetables, fastening bra, etc)    Time 4    Period Weeks    Status On-going      OT SHORT TERM GOAL #3   Title Pt will increase grip strength in LUE by 5 lbs  for in order to increase functional use in non dominant hand.    Baseline R 79.3 L 59    Time 4    Period Weeks    Status On-going      OT SHORT TERM GOAL #4   Title Pt will complete 66 blocks or greater in Box and Blocks with LUE without hitting the divider d/t undershooting.    Baseline 61 with L with undershooting approx 5 times.    Time 4    Period Weeks    Status New             OT Long Term Goals - 04/17/21 1008      OT LONG TERM GOAL #1   Title Pt will increase grip strength in LUE by 10 lbs in order to increase functional use in non dominant hand.    Baseline R 79.3, L 59    Time 8    Period Weeks    Status New    Target Date 06/12/21      OT LONG TERM GOAL #2   Title Pt will perform physical and cognitive task simultaneously with 100% accuracy    Time 8    Period Weeks    Status New      OT LONG TERM GOAL #3   Title Pt will verbalize understanding of return to driving recommendations    Time 8     Period Weeks    Status New      OT LONG TERM GOAL #4   Title Pt will perform work simulated tasks with 95% accuracy for prep for return to work.    Time 8    Period Weeks    Status New      OT LONG TERM GOAL #5   Title Pt will complete UE FOTO at discharge.    Time 8    Period Weeks    Status New                 Plan - 04/23/21 1617    Clinical Impression Statement Pt progressing towards goals. Pt with improved gross motor coordination today and working towards increased grip strength.    OT Occupational Profile and History Problem Focused Assessment - Including review of records relating to presenting problem    Occupational performance deficits (Please refer to evaluation for details): Work;Leisure;IADL's;ADL's    Body Structure / Function / Physical Skills ADL;IADL;FMC;Vision;UE functional use;Strength;Decreased knowledge of use of DME;Coordination;Dexterity;GMC    Cognitive Skills Attention    Rehab Potential Good    Clinical Decision Making Limited treatment options, no task modification necessary    Comorbidities Affecting Occupational Performance: None    Modification or Assistance to Complete Evaluation  No modification of tasks or assist necessary to complete eval    OT Frequency 1x / week    OT Duration 8 weeks   may d/c early based on progress.   OT Treatment/Interventions Self-care/ADL training;DME and/or AE instruction;Moist Heat;Therapeutic activities;Patient/family education;Neuromuscular education;Functional Mobility Training;Visual/perceptual remediation/compensation;Cognitive remediation/compensation;Therapeutic exercise    Plan coordination and grip strength HEP    Consulted and Agree with Plan of Care Patient           Patient will benefit from skilled therapeutic intervention in order to improve the following deficits and impairments:   Body Structure / Function / Physical Skills: ADL,IADL,FMC,Vision,UE functional use,Strength,Decreased knowledge  of use of DME,Coordination,Dexterity,GMC Cognitive Skills: Attention     Visit Diagnosis: Unsteadiness on feet  Other abnormalities of gait and mobility  Muscle weakness (generalized)  Other lack of coordination  Hemiplegia and hemiparesis following cerebral infarction affecting left non-dominant side (HCC)  Attention and concentration deficit    Problem List Patient Active Problem List   Diagnosis Date Noted  . 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 [place], [place], [address] Phone: [phone]   Fax:  [phone]  Name: Caroline Graham MRN: [id number] Date of Birth: 1991-11-10

## 2021-04-24 NOTE — Therapy (Cosign Needed Addendum)
[facility] [address] [place], [place], [address] Phone: [phone]   Fax:  [phone]  Physical Therapy Treatment  Patient Details  Name: Caroline Graham MRN: [id number] Date of Birth: 02/07/1991 Referring Provider (PT): Ihor Austin, NP)   Encounter Date: 04/23/2021   PT End of Session - 04/23/21 1542    Visit Number 3    Number of Visits 13    Authorization Type [employer] combined visits PT, OT, speech    PT Start Time 1532    PT Stop Time 1615    PT Time Calculation (min) 43 min    Activity Tolerance Patient tolerated treatment well   slight increase in symptoms   Behavior During Therapy [facility] for tasks assessed/performed           History reviewed. No pertinent past medical history.  Past Surgical History:  Procedure Laterality Date  . KNEE SURGERY    . TONSILLECTOMY      There were no vitals filed for this visit.   Subjective Assessment - 04/23/21 1537    Subjective No new complaints. No falls. Does still get dizzy, moslty with side to side head movements, when she goes to bed and getting up. Does also have episodes of dizziness with movements around the house or playing with puppy. They resolve quickly.    Pertinent History Left vertebral Artery Dissection    Patient Stated Goals Pt's goals for PT are to regain coordination and balance; build more confidence.    Currently in Pain? No/denies    Pain Score 0-No pain               Vestibular Assessment - 04/24/21 0001      Vestibular Assessment   General Observation performed by PT      Positional Testing   Sidelying Test Sidelying Right;Sidelying Left    Horizontal Canal Testing Horizontal Canal Right;Horizontal Canal Left      Sidelying Right   Sidelying Right Duration 0    Sidelying Right Symptoms No nystagmus      Sidelying Left   Sidelying Left Duration 0    Sidelying Left Symptoms No nystagmus      Horizontal Canal Right    Horizontal Canal Right Duration 0    Horizontal Canal Right Symptoms Normal      Horizontal Canal Left   Horizontal Canal Left Duration 0    Horizontal Canal Left Symptoms Normal               [facility] Adult PT Treatment/Exercise - 04/23/21 1616      Transfers   Transfers Sit to Stand;Stand to Sit    Sit to Stand 6: Modified independent (Device/Increase time);Without upper extremity assist;From chair/3-in-1    Stand to Sit 6: Modified independent (Device/Increase time);Without upper extremity assist;To chair/3-in-1      Ambulation/Gait   Ambulation/Gait Yes    Ambulation/Gait Assistance 6: Modified independent (Device/Increase time)    Ambulation Distance (Feet) --   around gym with session   Assistive device None    Gait Pattern Step-through pattern   Guarded gait, slowed compared to normal     Neuro Re-ed    Neuro Re-ed Details  reviewed current HEP. Added Bandt-Daroff and nose>knee for habiutation. No significant issues reported with performance in session. Mild increase in symptoms with certain movements that resolved quickly. Refer to [employer]/HEP to go for full details.               PT  Short Term Goals - 04/16/21 1854      PT SHORT TERM GOAL #1   Title Pt will be independent with HEP for improved strength, balance, transfers, and gait.  TARGET 05/09/2021    Time 4    Period Weeks    Status New      PT SHORT TERM GOAL #2   Title Pt will improve FGA score to at least 26/30 to decrease fall risk.    Baseline 23/30    Time 4    Period Weeks    Status New      PT SHORT TERM GOAL #3   Title Pt will ambulate at least 10 minutes on level, unlevel surfaces with no increase in reported unsteadiness/vestibular symptoms, for improved activity tolerance for return to leisure, work activities.    Time 4    Period Weeks    Status New      PT SHORT TERM GOAL #4   Title Pt will negotiate at least 12 steps, alternating pattern, no handrail, independnetly for improved  stair negotiation.    Time 4    Period Weeks    Status New             PT Long Term Goals - 04/16/21 1857      PT LONG TERM GOAL #1   Title Pt will be independent with HEP for improved strength, balance, transfers, and gait.  TARGET 06/06/2021    Time 8    Period Weeks    Status New      PT LONG TERM GOAL #2   Title Pt will improve FGA score to at least 28/30 to decrease fall risk.    Time 8    Period Weeks    Status New      PT LONG TERM GOAL #3   Title Pt will perform at least 20 minutes of continuous exercise/activity/gait tasks with no reported increase in vestibular symptoms.    Time 8    Period Weeks    Status New      PT LONG TERM GOAL #4   Title FOTO score for lower extremity to improve to at least 77 for improve overall return to PLOF.    Time 8    Period Weeks    Status New                 Plan - 04/24/21 1610    Clinical Impression Statement Today's skilled session intially focused on screening for positional vertigo by Adelfa Koh, PT, DPT with no positive findings. Remainder of session focused on habiution ex's with new ones added to HEP this session. The pt should benefit from continued PT to progress toward unmet goals.    Personal Factors and Comorbidities Comorbidity 1    Comorbidities hx of knee surgery (L)    Examination-Activity Limitations Locomotion Level;Transfers;Squat;Carry    Examination-Participation Restrictions Community Activity;Driving    Stability/Clinical Decision Making Stable/Uncomplicated    Rehab Potential Good    PT Frequency Other (comment)   2x/wk for 4 weeks, then 1x/wk for 4 weeks, after eval   PT Duration Other (comment)   total POC = 8 weeks   PT Treatment/Interventions ADLs/Self Care Home Management;Gait training;Stair training;Functional mobility training;Therapeutic activities;Therapeutic exercise;Balance training;Neuromuscular re-education;Patient/family education;Vestibular    PT Next Visit Plan work on balance-  including high level balance, compliant surface, gentle head and body motions; habiutation ex's    Consulted and Agree with Plan of Care Patient  Patient will benefit from skilled therapeutic intervention in order to improve the following deficits and impairments:  Abnormal gait,Decreased mobility,Decreased strength,Dizziness,Difficulty walking,Decreased activity tolerance  Visit Diagnosis: Unsteadiness on feet  Dizziness and giddiness  Other abnormalities of gait and mobility  Muscle weakness (generalized)     Problem List Patient Active Problem List   Diagnosis Date Noted  . Cerebellar infarct [facility]) 04/09/2021   Sallyanne Kuster, PTA, [facility] [address] [place], [place] [address] [phone] 04/24/21, 4:30 PM   Name: Caroline Graham MRN: [id number] Date of Birth: [birth year]   Tempie Donning, PT, DPT [facility] [address] [place], [place]  [address] Phone:  [phone] Fax:  [phone]

## 2021-04-29 ENCOUNTER — Other Ambulatory Visit: Payer: Self-pay

## 2021-04-29 ENCOUNTER — Encounter: Payer: Self-pay | Admitting: Physical Therapy

## 2021-04-29 ENCOUNTER — Ambulatory Visit: Payer: 59 | Admitting: Physical Therapy

## 2021-04-29 DIAGNOSIS — R2681 Unsteadiness on feet: Secondary | ICD-10-CM | POA: Diagnosis not present

## 2021-04-29 DIAGNOSIS — R2689 Other abnormalities of gait and mobility: Secondary | ICD-10-CM

## 2021-04-29 DIAGNOSIS — M6281 Muscle weakness (generalized): Secondary | ICD-10-CM

## 2021-04-29 NOTE — Therapy (Signed)
[facility] [address] [place], [place], [address] Phone: [phone]   Fax:  [phone]  Physical Therapy Treatment  Patient Details  Name: Caroline Graham MRN: [id number] Date of Birth: 05/16/1991 Referring Provider (PT): Ihor Austin, NP)   Encounter Date: 04/29/2021   PT End of Session - 04/29/21 1540    Visit Number 4    Number of Visits 13    Authorization Type [employer] combined visits PT, OT, speech    PT Start Time 1533    PT Stop Time 1612    PT Time Calculation (min) 39 min    Activity Tolerance Patient tolerated treatment well   slight increase in symptoms   Behavior During Therapy [facility] for tasks assessed/performed           History reviewed. No pertinent past medical history.  Past Surgical History:  Procedure Laterality Date  . KNEE SURGERY    . TONSILLECTOMY      There were no vitals filed for this visit.   Subjective Assessment - 04/29/21 1536    Subjective No new complaints. Has continued to have symptoms off and on. Mostly when she fully unloaded and loaded the dishwasher yesterday. Had to lie down and nap afterwards due to headache and fatigue coming on. Going grocery shopping today after therapies.    Pertinent History Left vertebral Artery Dissection    Currently in Pain? Yes    Pain Score 3     Pain Location Head    Pain Descriptors / Indicators Aching;Headache    Pain Type Acute pain    Pain Onset 1 to 4 weeks ago    Pain Frequency Constant    Aggravating Factors  certain movements    Pain Relieving Factors rest, dark spaces, Tylenol                [facility] Adult PT Treatment/Exercise - 04/29/21 1543      Transfers   Transfers Sit to Stand;Stand to Sit    Sit to Stand 6: Modified independent (Device/Increase time);Without upper extremity assist;From chair/3-in-1    Stand to Sit 6: Modified independent (Device/Increase time);Without upper extremity assist;To  chair/3-in-1      Ambulation/Gait   Ambulation/Gait Yes    Ambulation/Gait Assistance 6: Modified independent (Device/Increase time)    Assistive device None    Gait Pattern Step-through pattern    Ambulation Surface Level;Indoor      Neuro Re-ed    Neuro Re-ed Details  for balance/habituation: gait around track looking right, left, up and down for a few step each with forward gaze between, most symptoms with looking right, resolved quickily with looking back forward; gait around track while tracking ball vertically, horizontally, then circlular both ways for 1 lap each, mild symtpoms with circle directions only. Min guard assist for all activites for safety.               Balance Exercises - 04/29/21 1551      Balance Exercises: Standing   Standing Eyes Closed Foam/compliant surface;Narrow base of support (BOS);Wide (BOA);Head turns;Other reps (comment);30 secs;Limitations    Standing Eyes Closed Limitations on airex with no UE support: feet apart to feet together with EC 30 sec's x 3 reps each position, then feet back apart for EC head movments left<>right, up<>down for ~10 reps each. min guard to min assist for balance with cues on posture/weight shifting to assist with balance. occasional touch to walls/chair as needed for balance.    Rockerboard  Anterior/posterior;Lateral;EO;EC;30 seconds;Other reps (comment);Limitations    Rockerboard Limitations performed both ways on balance board: rocking the board with EO, progressing to [facility] with emphasis on tall posture; then holding the board steady for EC 30 sec's x 3 reps. min guard to min assist for balance with cues on posture/weight shifting for balance.               PT Short Term Goals - 04/16/21 1854      PT SHORT TERM GOAL #1   Title Pt will be independent with HEP for improved strength, balance, transfers, and gait.  TARGET 05/09/2021    Time 4    Period Weeks    Status New      PT SHORT TERM GOAL #2   Title Pt will  improve FGA score to at least 26/30 to decrease fall risk.    Baseline 23/30    Time 4    Period Weeks    Status New      PT SHORT TERM GOAL #3   Title Pt will ambulate at least 10 minutes on level, unlevel surfaces with no increase in reported unsteadiness/vestibular symptoms, for improved activity tolerance for return to leisure, work activities.    Time 4    Period Weeks    Status New      PT SHORT TERM GOAL #4   Title Pt will negotiate at least 12 steps, alternating pattern, no handrail, independnetly for improved stair negotiation.    Time 4    Period Weeks    Status New             PT Long Term Goals - 04/16/21 1857      PT LONG TERM GOAL #1   Title Pt will be independent with HEP for improved strength, balance, transfers, and gait.  TARGET 06/06/2021    Time 8    Period Weeks    Status New      PT LONG TERM GOAL #2   Title Pt will improve FGA score to at least 28/30 to decrease fall risk.    Time 8    Period Weeks    Status New      PT LONG TERM GOAL #3   Title Pt will perform at least 20 minutes of continuous exercise/activity/gait tasks with no reported increase in vestibular symptoms.    Time 8    Period Weeks    Status New      PT LONG TERM GOAL #4   Title FOTO score for lower extremity to improve to at least 77 for improve overall return to PLOF.    Time 8    Period Weeks    Status New                 Plan - 04/29/21 1540    Clinical Impression Statement Today's skilled session continued to focus on balance and habituation ex's with mild symptoms reported at times that resolved quickly. No significant issues noted or reported in session. The pt is progressing toward goals and should benefit from continued PT to progress toward unmet goals.    Personal Factors and Comorbidities Comorbidity 1    Comorbidities hx of knee surgery (L)    Examination-Activity Limitations Locomotion Level;Transfers;Squat;Carry    Examination-Participation  Restrictions Community Activity;Driving    Stability/Clinical Decision Making Stable/Uncomplicated    Rehab Potential Good    PT Frequency Other (comment)   2x/wk for 4 weeks, then 1x/wk for 4 weeks, after eval  PT Duration Other (comment)   total POC = 8 weeks   PT Treatment/Interventions ADLs/Self Care Home Management;Gait training;Stair training;Functional mobility training;Therapeutic activities;Therapeutic exercise;Balance training;Neuromuscular re-education;Patient/family education;Vestibular    PT Next Visit Plan work on balance- including high level balance, compliant surface, gentle head and body motions; habiutation ex's    Consulted and Agree with Plan of Care Patient           Patient will benefit from skilled therapeutic intervention in order to improve the following deficits and impairments:  Abnormal gait,Decreased mobility,Decreased strength,Dizziness,Difficulty walking,Decreased activity tolerance  Visit Diagnosis: Unsteadiness on feet  Other abnormalities of gait and mobility  Muscle weakness (generalized)     Problem List Patient Active Problem List   Diagnosis Date Noted  . Cerebellar infarct [facility]) 04/09/2021    Sallyanne Kuster, PTA, [facility] [address] [place], [place] [address] [phone] 04/29/21, 9:56 PM   Name: Caroline Graham MRN: [id number] Date of Birth: 1991-08-17

## 2021-04-30 ENCOUNTER — Ambulatory Visit: Payer: 59 | Admitting: Occupational Therapy

## 2021-04-30 ENCOUNTER — Ambulatory Visit: Payer: 59 | Admitting: Physical Therapy

## 2021-04-30 ENCOUNTER — Encounter: Payer: Self-pay | Admitting: Occupational Therapy

## 2021-04-30 DIAGNOSIS — I69354 Hemiplegia and hemiparesis following cerebral infarction affecting left non-dominant side: Secondary | ICD-10-CM

## 2021-04-30 DIAGNOSIS — R2681 Unsteadiness on feet: Secondary | ICD-10-CM | POA: Diagnosis not present

## 2021-04-30 DIAGNOSIS — R41840 Attention and concentration deficit: Secondary | ICD-10-CM

## 2021-04-30 DIAGNOSIS — R278 Other lack of coordination: Secondary | ICD-10-CM

## 2021-04-30 DIAGNOSIS — R2689 Other abnormalities of gait and mobility: Secondary | ICD-10-CM

## 2021-04-30 DIAGNOSIS — M6281 Muscle weakness (generalized): Secondary | ICD-10-CM

## 2021-04-30 NOTE — Patient Instructions (Signed)
RETURN TO DRIVING PLAN:   Communicate with physician first and get clearance before beginning any driving.   Once cleared:  WITH THE SUPERVISION OF A LICENSED DRIVER, PLEASE DRIVE IN AN EMPTY PARKING LOT FOR AT LEAST 2-3 TRIALS TO TEST REACTION TIME, VISION, USE OF EQUIPMENT IN CAR, ETC.   IF SUCCESSFUL WITH THE PARKING LOT DRIVING, PROCEED TO SUPERVISED DRIVING TRIALS IN YOUR NEIGHBORHOOD STREETS AT LOW TRAFFIC TIMES TO TEST OBSERVATION TO TRAFFIC SIGNALS, REACTION TIME, ETC. PLEASE ATTEMPT AT LEAST 2-3 TRIALS IN YOUR NEIGHBORHOOD.   IF NEIGHBORHOOD DRIVING IS SUCCESSFUL, YOU MAY PROCEED TO DRIVING IN BUSIER AREAS IN YOUR COMMUNITY WITH SUPERVISION OF A LICENSED DRIVER. PLEASE ATTEMPT AT LEAST 4-5 TRIALS.   IF COMMUNITY DRIVING IS SUCCESSFUL, YOU MAY PROCEED TO DRIVING ALONE, DURING THE DAY TIME, IN NON-PEAK TRAFFIC TIMES. YOU SHOULD DRIVE NO FURTHER THAN 15 MINUTES IN ONE DIRECTION. PLEASE DO NOT DRIVE IF YOU FEEL FATIGUED OR UNDER THE INFLUENCE OF MEDICATION.   

## 2021-04-30 NOTE — Patient Instructions (Signed)
Access Code: [email or web address] URL: [email or web address] Date: 04/30/2021 Prepared by: Lonia Blood  Exercises Standing Balance in Corner - 1-2 x daily - 7 x weekly - 1-2 sets - 10 reps Standing Balance in Corner with Eyes Closed - 1-2 x daily - 7 x weekly - 1 sets - 10 reps

## 2021-04-30 NOTE — Therapy (Signed)
[facility] [address] [place], [place], [address] Phone: [phone]   Fax:  [phone]  Occupational Therapy Treatment  Patient Details  Name: Caroline Graham MRN: [id number] Date of Birth: 1991/09/17 Referring Provider (OT): follows up with Frann Rider, NP   Encounter Date: 04/30/2021   OT End of Session - 04/30/21 1620    Visit Number 3    Number of Visits 9    Date for OT Re-Evaluation 06/11/21    Authorization Type [facility] 2022    Authorization Time Period $30 copay per discipline 90 ST/OT/PT    OT Start Time 1620    OT Stop Time 1700    OT Time Calculation (min) 40 min    Activity Tolerance Patient tolerated treatment well    Behavior During Therapy [facility] for tasks assessed/performed           History reviewed. No pertinent past medical history.  Past Surgical History:  Procedure Laterality Date  . KNEE SURGERY    . TONSILLECTOMY      There were no vitals filed for this visit.   Subjective Assessment - 04/30/21 1620    Subjective  Faint headache - like a 1. Pt denies any changes or pain.    Pertinent History PMH: arterial dissection, CVA    Patient Stated Goals to get back to my baseline, safely use knife    Currently in Pain? Yes    Pain Score 1     Pain Location Head    Pain Orientation Upper    Pain Descriptors / Indicators Headache    Pain Type Acute pain    Pain Onset 1 to 4 weeks ago    Pain Frequency Constant             Assessed goals. See below.  Hand Gripper: with LUE on level 5 with gold spring. Downgraded to level 4 approx 50% way through. Pt picked up 1 inch blocks with gripper with min drops and min difficulty.  Physical/Cognitive with 100% accuracy with subtraction of 3's while in hand manipulation in LUE with golf balls.   Constant Therapy Alternating Symbols level 6 with 98% accuracy and 43.88s response time. Alphabetizing Alternating Words (uppercase/lowercase)  level 3 - unable to finish and get score but patient attended to the task well.                  OT Education - 04/30/21 1637    Education Details went over return to driving recommendations however not advised to drive at this time - consult with neurologist and physician prior.    Person(s) Educated Patient    Methods Explanation;Handout    Comprehension Verbalized understanding            OT Short Term Goals - 04/30/21 1621      OT SHORT TERM GOAL #1   Title Pt will be independent with HEP for grip strength    Time 4    Period Weeks    Status Achieved   issued red theraputty 04/23/21   Target Date 05/15/21      OT SHORT TERM GOAL #2   Title Pt will verbalize understanding of adapted strategies for increasing safety awareness and independence with ADLs and IADLs (cutting vegetables, fastening bra, etc)    Time 4    Period Weeks    Status Achieved      OT SHORT TERM GOAL #3   Title Pt will increase grip strength in LUE  by 5 lbs for in order to increase functional use in non dominant hand.    Baseline R 79.3 L 59    Time 4    Period Weeks    Status Achieved   72.5 lbs with LUE 04/30/21     OT SHORT TERM GOAL #4   Title Pt will complete 66 blocks or greater in Box and Blocks with LUE without hitting the divider d/t undershooting.    Baseline 61 with L with undershooting approx 5 times.    Time 4    Period Weeks    Status Achieved   67 blocks with L 04/30/21            OT Long Term Goals - 04/30/21 1626      OT LONG TERM GOAL #1   Title Pt will increase grip strength in LUE by 10 lbs in order to increase functional use in non dominant hand.    Baseline R 79.3, L 59    Time 8    Period Weeks    Status On-going   72.5 lbs     OT LONG TERM GOAL #2   Title Pt will perform physical and cognitive task simultaneously with 100% accuracy    Time 8    Period Weeks    Status Achieved   subtraction while in hand manipulation and ambulating 100%     OT LONG  TERM GOAL #3   Title Pt will verbalize understanding of return to driving recommendations    Time 8    Period Weeks    Status Achieved   pt not ready for driving at this time. Issued return to driving suggestions with physician's approval.     OT LONG TERM GOAL #4   Title Pt will perform work simulated tasks with 95% accuracy for prep for return to work.    Time 8    Period Weeks    Status On-going   pt not returning to work at this time. on hold until sees neurologist     OT LONG TERM GOAL #5   Title Pt will complete UE FOTO at discharge.    Time 8    Period Weeks    Status New                 Plan - 04/30/21 1645    Clinical Impression Statement Pt progressing towards goals. Pt has met all STGs and is progressing towards remaining LTGs.    OT Occupational Profile and History Problem Focused Assessment - Including review of records relating to presenting problem    Occupational performance deficits (Please refer to evaluation for details): Work;Leisure;IADL's;ADL's    Body Structure / Function / Physical Skills ADL;IADL;FMC;Vision;UE functional use;Strength;Decreased knowledge of use of DME;Coordination;Dexterity;GMC    Cognitive Skills Attention    Rehab Potential Good    Clinical Decision Making Limited treatment options, no task modification necessary    Comorbidities Affecting Occupational Performance: None    Modification or Assistance to Complete Evaluation  No modification of tasks or assist necessary to complete eval    OT Frequency 1x / week    OT Duration 8 weeks   may d/c early based on progress.   OT Treatment/Interventions Self-care/ADL training;DME and/or AE instruction;Moist Heat;Therapeutic activities;Patient/family education;Neuromuscular education;Functional Mobility Training;Visual/perceptual remediation/compensation;Cognitive remediation/compensation;Therapeutic exercise    Plan work more towards visually busy tasks, screens etc. for work simulated.     Consulted and Agree with Plan of Care Patient  Patient will benefit from skilled therapeutic intervention in order to improve the following deficits and impairments:   Body Structure / Function / Physical Skills: ADL,IADL,FMC,Vision,UE functional use,Strength,Decreased knowledge of use of DME,Coordination,Dexterity,GMC Cognitive Skills: Attention     Visit Diagnosis: Muscle weakness (generalized)  Other lack of coordination  Hemiplegia and hemiparesis following cerebral infarction affecting left non-dominant side (HCC)  Attention and concentration deficit    Problem List Patient Active Problem List   Diagnosis Date Noted  . Cerebellar infarct [facility]) 04/09/2021    Zachery Conch MOT, OTR/L  04/30/2021, 4:51 PM  [facility] [address] [place], [place], [address] Phone: [phone]   Fax:  [phone]  Name: Caroline Graham MRN: [id number] Date of Birth: Jul 19, 1991

## 2021-05-01 NOTE — Therapy (Addendum)
[facility] [address] [place], [place], [address] Phone: [phone]   Fax:  [phone]  Physical Therapy Treatment  Patient Details  Name: Caroline Graham MRN: [id number] Date of Birth: [birth year] Referring Provider (PT): Ihor Austin, NP)   Encounter Date: 04/30/2021   PT End of Session - 05/01/21 2001    Visit Number 5    Number of Visits 13    Authorization Type [employer] combined visits PT, OT, speech    PT Start Time 1747    PT Stop Time 1831    PT Time Calculation (min) 44 min    Activity Tolerance Patient tolerated treatment well   slight increase in symptoms   Behavior During Therapy [facility] for tasks assessed/performed           No past medical history on file.  Past Surgical History:  Procedure Laterality Date  . KNEE SURGERY    . TONSILLECTOMY      There were no vitals filed for this visit.   Subjective Assessment - 04/30/21 1750    Subjective Feel like things are going a little better everyday.  Feels like the dizziness is getting a little better everyday.    Pertinent History Left vertebral Artery Dissection    Patient Stated Goals Pt's goals for PT are to regain coordination and balance; build more confidence.    Currently in Pain? Yes    Pain Score 2     Pain Location Head    Pain Orientation Upper    Pain Descriptors / Indicators Headache;Dull    Pain Type Acute pain    Pain Onset 1 to 4 weeks ago    Pain Frequency Constant    Aggravating Factors  certain movements-looking    Pain Relieving Factors rest, Tylenol                             [facility] Adult PT Treatment/Exercise - 04/30/21 1750      Ambulation/Gait   Ambulation/Gait Yes    Ambulation/Gait Assistance 6: Modified independent (Device/Increase time)    Ambulation Distance (Feet) 460 Feet   230   Assistive device None    Gait Pattern Step-through pattern    Ambulation Surface Level;Indoor    Gait  Comments Gait with head turns/head nods long straightaway of gym, then return to normal looking ahead with gait x 50-100 ft; gait with ball circles clockwise and counterclockwise.  Short distance gait with head turns, 20 ft x 2 reps, then head nods 20 ft x 2, with turns to change direction.               Balance Exercises - 04/30/21 1750      Balance Exercises: Standing   Standing Eyes Opened Foam/compliant surface;Wide (BOA);Narrow base of support (BOS)   Head turns, head nods x 30 sec each   Standing Eyes Closed Foam/compliant surface;Narrow base of support (BOS);Wide (BOA);Head turns;Other reps (comment);30 secs;Limitations    Standing Eyes Closed Limitations on airex with no UE support: feet apart/together for EC head movments left<>right, up<>down for ~10 reps each. min guard to min assist for balance with cues on posture/weight shifting to assist with balance. occasional touch to walls/chair as needed for balance.  Rates dizziness up to 3/10 (from 2/10)    Other Standing Exercises STanding in corner, reaching across to opposite wall:  eye level x 10, reaching up x 10, reaching low x 10  Other Standing Exercises Comments Standing in corner, diagonal reaches Up>down, with ball, 10 reps each direction.  Dizinees rating stays about 2/10.  Standing in gym area, widened BOS with holding ball, looking/reaching down to one foot then up to midline, then looking/reaching down to opposite foot, then looking up to midline x 10 reps; increase in symptoms to 3/10.  Cues to return to midline with visual target to help symptoms settle, which they do in 10 sec or less.          Rockerboard in ant/posterior and in lateral directions: Rocking board in ant/post and lateral directions x 10 reps each Head motions EO x 5 and EC x 5 reps with close supervision/ min guard    PT Education - 05/01/21 2001    Education Details Additions to [employer]) Educated Patient    Methods  Explanation;Demonstration;Handout    Comprehension Verbalized understanding            PT Short Term Goals - 04/16/21 1854      PT SHORT TERM GOAL #1   Title Pt will be independent with HEP for improved strength, balance, transfers, and gait.  TARGET 05/09/2021    Time 4    Period Weeks    Status New      PT SHORT TERM GOAL #2   Title Pt will improve FGA score to at least 26/30 to decrease fall risk.    Baseline 23/30    Time 4    Period Weeks    Status New      PT SHORT TERM GOAL #3   Title Pt will ambulate at least 10 minutes on level, unlevel surfaces with no increase in reported unsteadiness/vestibular symptoms, for improved activity tolerance for return to leisure, work activities.    Time 4    Period Weeks    Status New      PT SHORT TERM GOAL #4   Title Pt will negotiate at least 12 steps, alternating pattern, no handrail, independnetly for improved stair negotiation.    Time 4    Period Weeks    Status New             PT Long Term Goals - 04/16/21 1857      PT LONG TERM GOAL #1   Title Pt will be independent with HEP for improved strength, balance, transfers, and gait.  TARGET 06/06/2021    Time 8    Period Weeks    Status New      PT LONG TERM GOAL #2   Title Pt will improve FGA score to at least 28/30 to decrease fall risk.    Time 8    Period Weeks    Status New      PT LONG TERM GOAL #3   Title Pt will perform at least 20 minutes of continuous exercise/activity/gait tasks with no reported increase in vestibular symptoms.    Time 8    Period Weeks    Status New      PT LONG TERM GOAL #4   Title FOTO score for lower extremity to improve to at least 77 for improve overall return to PLOF.    Time 8    Period Weeks    Status New                 Plan - 05/01/21 2002    Clinical Impression Statement Continued balance and gait activities with habituation and vestibular system retraining exercises.  Minimal  symtpoms brought on, typically  with lower diagonals/return to midline, which resolve quickly.  She continues to report symptoms improving overall, but she will continue to benefit from skilled PT to further address balance, gait, vestibular system retraining for improved return to independent PLOF.    Personal Factors and Comorbidities Comorbidity 1    Comorbidities hx of knee surgery (L)    Examination-Activity Limitations Locomotion Level;Transfers;Squat;Carry    Examination-Participation Restrictions Community Activity;Driving    Stability/Clinical Decision Making Stable/Uncomplicated    Rehab Potential Good    PT Frequency Other (comment)   2x/wk for 4 weeks, then 1x/wk for 4 weeks, after eval   PT Duration Other (comment)   total POC = 8 weeks   PT Treatment/Interventions ADLs/Self Care Home Management;Gait training;Stair training;Functional mobility training;Therapeutic activities;Therapeutic exercise;Balance training;Neuromuscular re-education;Patient/family education;Vestibular    PT Next Visit Plan Review HEP and continue to work on balance- including high level balance, compliant surface, gentle head and body motions; habiutation ex's    Consulted and Agree with Plan of Care Patient           Patient will benefit from skilled therapeutic intervention in order to improve the following deficits and impairments:  Abnormal gait,Decreased mobility,Decreased strength,Dizziness,Difficulty walking,Decreased activity tolerance  Visit Diagnosis: Unsteadiness on feet  Other abnormalities of gait and mobility     Problem List Patient Active Problem List   Diagnosis Date Noted  . Cerebellar infarct (HCC) 04/09/2021    Chanler Mendonca W. 05/01/2021, 8:04 PM  Gean Maidens., PT   [facility] [address] [place], [place], [address] Phone: [phone]   Fax:  [phone]  Name: ADELEINE PASK MRN: [id number] Date of Birth: Apr 24, 1991

## 2021-05-05 NOTE — Progress Notes (Signed)
[facility] [address] [place]. [place] [address] [phone]       HOSPITAL FOLLOW UP NOTE  [name] Date of Birth:  Feb 10, 1991 Medical Record Number:  [id number]   Reason for Referral:  hospital stroke follow up    SUBJECTIVE:   CHIEF COMPLAINT:  Chief Complaint  Patient presents with  . Follow-up    TR  Pt has some slight dizziness, headaches, balance and correlation complications.     HPI:   [name] is a 30 y.o. female with no significant PMH who presented on 04/09/2021 with headache, neck pain, and dizziness.    Personally reviewed hospitalization pertinent progress notes, lab work and imaging with summary provided.  Evaluated by Dr. 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  Recommended DAPT for 3 months then aspirin alone and repeat CTA head/neck 2-3 months for monitoring.  LDL 122 - started atorvastatin 40 mg daily.  Questionable BA tip small aneurysm on CTA head -recommended follow-up imaging.  Continued headaches, dizziness and nausea with symptom management with use of migraine cocktail hand tramadol, meclizine and Zofran.   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 right vertebral artery is diminutive and slightly irregular  MRI left PICA infarct  Repeat CT: No progression or hemorrhage at the left cerebellar infarct. The fourth ventricle is patent.  2D Echo: EF 60-65%  LDL 122  HgbA1c 5.2  UDS negative  VTE prophylaxis - SCDs  Diet: heart healthy  No antithrombotic prior to admission, now on aspirin 325 mg daily and clopidogrel 75 mg daily.  Continue DAPT for 3 months and then aspirin 81 alone.  Therapy recommendations:  Outpatient PT  Disposition: home   Today, 05/06/2021, Caroline Graham is being seen for  hospital follow-up accompanied by her husband, Caroline Graham  Stable from stroke standpoint without new or worsening stroke/TIA symptoms -imbalance but improving - currently working with PT. ambulates without assistive device -Occasional dizziness usually with increased exertion but overall greatly improving.  She trial use of meclizine once which was discharged hospitalization but unsure of benefit as she fell asleep - symptoms quickly subsided after sitting and resting -LUE weakness/incoordination improving with only mild difficulty -currently working with OT.   -Daily headache which can increase in severity approx 2-3x per week usually with increased activity which are debilitating and associated with photophobia and phonophobia.  Denies nausea/vomiting.  Occasional use of Tylenol with some benefit.  Denies history of frequent headaches or migraines -Mild neck pain but overall greatly improving.  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 Hx of anxiety at baseline currently mild denies worsening stroke.  Denies depression  Compliant on aspirin and Plavix without associated side effects Compliant on atorvastatin without associated side effects Blood pressure today 121/78 -does not routinely monitor at home  Asked appropriate questions during visit such as any physical restrictions or limitations in setting of dissection, cause of dissection and typical recovery time, and ongoing use of above medications  No further concerns at this time    ROS:   14 system review of systems performed and negative with exception of those listed in HPI  PMH: History reviewed. No pertinent past medical  history.  PSH:  Past Surgical History:  Procedure Laterality Date  . KNEE SURGERY    . TONSILLECTOMY      Social History:  Social History   Socioeconomic  History  . Marital status: Single    Spouse name: Not on file  . Number of children: Not on file  . Years of education: Not on file  . Highest education level: Not on file  Occupational History  . Not on file  Tobacco Use  . Smoking status: Current Every Day Smoker  . Smokeless tobacco: Not on file  Substance and Sexual Activity  . Alcohol use: Yes  . Drug use: No  . Sexual activity: Yes    Birth control/protection: Condom  Other Topics Concern  . Not on file  Social History Narrative  . Not on file   Social Determinants of Health   Financial Resource Strain: Not on file  Food Insecurity: Not on file  Transportation Needs: Not on file  Physical Activity: Not on file  Stress: Not on file  Social Connections: Not on file  Intimate Partner Violence: Not on file    Family History: History reviewed. No pertinent family history.  Medications:   Current Outpatient Medications on File Prior to Visit  Medication Sig Dispense Refill  . atorvastatin (LIPITOR) 40 MG tablet Take 1 tablet (40 mg total) by mouth daily. 30 tablet 3  . clopidogrel (PLAVIX) 75 MG tablet Take 1 tablet (75 mg total) by mouth daily. 90 tablet 0  . meclizine (ANTIVERT) 25 MG tablet Take 1 tablet (25 mg total) by mouth 3 (three) times daily as needed for dizziness. 30 tablet 0  . methocarbamol (ROBAXIN) 500 MG tablet Take 1 tablet (500 mg total) by mouth 2 (two) times daily. (Patient taking differently: Take 500 mg by mouth at bedtime.) 20 tablet 0  . senna-docusate (SENOKOT-S) 8.6-50 MG tablet Take 1 tablet by mouth at bedtime as needed for mild constipation.     No current facility-administered medications on file prior to visit.    Allergies:   Allergies  Allergen Reactions  . Ceclor [Cefaclor]   . Penicillins       OBJECTIVE:  Physical Exam  Vitals:   05/06/21 0806  BP: 121/78  Pulse: 81  Weight: 151 lb (68.5 kg)  Height: 5\' 7"  (1.702 m)   Body mass index is 23.65 kg/m. No exam data  present  Post stroke PHQ 2/9 Depression screen PHQ 2/9 05/06/2021  Decreased Interest 0  Down, Depressed, Hopeless 0  PHQ - 2 Score 0     General: well developed, well nourished, young very pleasant Caucasian female, seated, in no evident distress Head: head normocephalic and atraumatic.   Neck: supple with no carotid or supraclavicular bruits Cardiovascular: regular rate and rhythm, no murmurs Musculoskeletal: no deformity Skin:  no rash/petichiae Vascular:  Normal pulses all extremities   Neurologic Exam Mental Status: Awake and fully alert.   Fluent speech and language.  Oriented to place and time. Recent and remote memory intact. Attention span, concentration and fund of knowledge appropriate. Mood and affect appropriate.  Cranial Nerves: Fundoscopic exam reveals sharp disc margins. Pupils equal, briskly reactive to light. Extraocular movements full without nystagmus. Visual fields full to confrontation. Hearing intact. Facial sensation intact. Face, tongue, palate moves normally and symmetrically.  Motor: Normal bulk and tone. Normal strength in all tested extremity muscles Sensory.: intact to touch , pinprick , position and vibratory sensation.  Coordination: Rapid alternating movements normal in all  extremities except slightly decreased left hand dexterity. Finger-to-nose and heel-to-shin performed accurately bilaterally. Gait and Station: Arises from chair without difficulty. Stance is normal. Gait demonstrates normal stride length and balance without use of assistive device. Tandem walk and heel toe without difficulty.  Romberg mildly positive Reflexes: 1+ and symmetric. Toes downgoing.     NIHSS  0 Modified Rankin  2      ASSESSMENT: Caroline Graham is a 30 y.o. 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 Vascular risk factors include bilateral vertebral artery  dissection, HLD and questionable BA tip small aneurysm.      PLAN:  1. L PICA stroke :  a. Residual deficit: Gait impairment with imbalance, occasional dizziness, headaches and mild neck pain.  Continue PT/OT for likely ongoing recovery. Start topiramate 25 mg nightly for daily headache post stroke -advised to call after 1 week if no benefit or sooner with possible adverse effects which were further discussed.  PCP currently assisting with short-term disability b. Continue aspirin 81 mg daily and clopidogrel 75 mg daily  and atorvastatin for secondary stroke prevention.   c. Discussed secondary stroke prevention measures and importance of close PCP follow up for aggressive stroke risk factor management  2. Bilateral vertebral artery dissection:  a. Possible etiology neck in extended position for prolonged period of time prior to symptom onset.  b. She questions possible underlying disorder contributing to dissection - per CTA report, suspicion for possible underlying vasculopathy or large vessel vasculitis - will plan on repeating mid July and will refer for further evaluation if indicated c. CTA head/neck 03/2021 CTA head & neck left vertebral artery high grade stenosis at C1 level. right vertebral artery is diminutive and stenosis also at the C1 level. d. Complete 3 months DAPT and then aspirin alone e. Discussed restrictions such as avoiding lifting heavy objects, avoidance of contact sports, neck manipulation or activities that involve abrupt movement. After repeat imaging, can discuss future limitations/restrictions 3. ? BA tip small aneurysm:  a. CTA head and neck showed slightly bulbous appearance of the basilar artery tip, measuring 3 mm in transverse dimension. A small basilar tip aneurysm cannot be excluded.  b. Will further evaluate with repeat CTA head/neck for dissection f/u c. Denies family history of aneurysm or rupture, no smoking hx, no HTN hx  4. HLD: LDL goal <70. Recent LDL  122 - started atorvastatin 40mg  daily.  Currently nonfasting. Plans to f/u with PCP for repeat lipid panel.  Request ongoing monitoring and prescribing per PCP if able    Follow up in 3 months or call earlier if needed   CC:  [facility] provider: Dr. PCP: Pearlean Brownie, PA-C    I spent 64 minutes of face-to-face and non-face-to-face time with patient and husband.  This included previsit chart review including extensive review of recent hospitalization pertinent progress notes, lab work and imaging which was also extensively reviewed with patient and husband, order entry, electronic health record documentation, and patient and husband education regarding recent stroke and residual deficits, typical recovery time, importance of managing stroke risk factors and compliance with all prescribed medications, vertebral artery dissection including possible etiology and indication for repeat imaging in the future, restrictions and limitations, possible cerebral aneurysm and indications for imaging in the future and answered all other questions to patient and husband satisfaction   Royann Shivers, AGNP-BC  [facility] [phone]  [place], [place] [address]  Phone [phone] Fax [phone] Note: This document was prepared with digital dictation and possible smart phrase technology. Any transcriptional errors that result from this process are unintentional.

## 2021-05-06 ENCOUNTER — Other Ambulatory Visit: Payer: Self-pay

## 2021-05-06 ENCOUNTER — Encounter: Payer: Self-pay | Admitting: Adult Health

## 2021-05-06 ENCOUNTER — Ambulatory Visit (INDEPENDENT_AMBULATORY_CARE_PROVIDER_SITE_OTHER): Payer: 59 | Admitting: Adult Health

## 2021-05-06 ENCOUNTER — Ambulatory Visit: Payer: 59 | Admitting: Physical Therapy

## 2021-05-06 VITALS — BP 121/78 | HR 81 | Ht 67.0 in | Wt 151.0 lb

## 2021-05-06 DIAGNOSIS — R2681 Unsteadiness on feet: Secondary | ICD-10-CM

## 2021-05-06 DIAGNOSIS — E785 Hyperlipidemia, unspecified: Secondary | ICD-10-CM

## 2021-05-06 DIAGNOSIS — I639 Cerebral infarction, unspecified: Secondary | ICD-10-CM

## 2021-05-06 DIAGNOSIS — I725 Aneurysm of other precerebral arteries: Secondary | ICD-10-CM

## 2021-05-06 DIAGNOSIS — R2689 Other abnormalities of gait and mobility: Secondary | ICD-10-CM

## 2021-05-06 DIAGNOSIS — R42 Dizziness and giddiness: Secondary | ICD-10-CM

## 2021-05-06 DIAGNOSIS — I7774 Dissection of vertebral artery: Secondary | ICD-10-CM | POA: Diagnosis not present

## 2021-05-06 DIAGNOSIS — I69398 Other sequelae of cerebral infarction: Secondary | ICD-10-CM

## 2021-05-06 DIAGNOSIS — R519 Headache, unspecified: Secondary | ICD-10-CM

## 2021-05-06 DIAGNOSIS — R269 Unspecified abnormalities of gait and mobility: Secondary | ICD-10-CM

## 2021-05-06 MED ORDER — TOPIRAMATE 25 MG PO TABS: 25.0000 mg | ORAL_TABLET | Freq: Every day | ORAL | 5 refills | Status: DC

## 2021-05-06 MED ORDER — ASPIRIN EC 81 MG PO TBEC: 325.0000 mg | DELAYED_RELEASE_TABLET | Freq: Every day | ORAL | Status: DC

## 2021-05-06 NOTE — Patient Instructions (Addendum)
Access Code: [email or web address] URL: [email or web address] Date: 05/06/2021 Prepared by: Lonia Blood  Exercises Standing Balance in Corner - 1-2 x daily - 7 x weekly - 1-2 sets - 10 reps Standing Balance in Corner with Eyes Closed - 1-2 x daily - 7 x weekly - 1 sets - 10 reps Added 05/06/2021: Walking with Head Rotation - 2-3 x daily - 7 x weekly - 1 sets - 3-5 reps Also added walking in neighborhood, 5-10 minutes, 1-2 x/day with family supervision

## 2021-05-06 NOTE — Patient Instructions (Addendum)
Start topamax  nightly - continue for 1 week then let me know how you are doing and we can consider increasing dose if needed.  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       Followup in the future with me in 3 months or call earlier if needed       Thank you for coming to see [place] at [facility]. I hope we have been able to provide you high quality care today.  You may receive a patient satisfaction survey over the next few weeks. We would appreciate your feedback and comments so that we may continue to improve ourselves and the health of our patients.    Vertebral Artery Dissection  Vertebral artery dissection is a tear in a vertebral artery. The vertebral arteries are major blood vessels at the base of the neck. They carry blood from the heart to the brain. When an artery tears, blood collects inside the layers of the artery wall. This can cause a blood clot. This condition increases the risk for stroke if it is not diagnosed and treated right away. It is a common cause of stroke in people who are 10-86 years old. What are the causes? This condition may be caused by:  A neck injury due to sudden or too much neck movement.  Having weak blood vessel walls. The walls may tear even when no injury occurs (spontaneous dissection). What increases the risk? The following factors may make you more likely to  develop this condition:  High blood pressure (hypertension).  Migraines.  Inherited diseases that affect the strength or shape of the blood vessels. What are the signs or symptoms? Symptoms usually appear within days of an injury, but sometimes they may not appear for weeks or years. Common symptoms of this condition include:  Stabbing, sharp pain in the head, neck, eye, or face.  Dizziness.  Vertigo. This is a feeling that you or things around you are moving when they are not.  Double vision. Other symptoms include:  Hoarse voice.  Hearing loss.  Hiccups.  Loss of taste.  Difficulty speaking.  Loss of balance.  Difficulty swallowing.  Ear pain.  Nausea and vomiting.  Loss of feeling in the torso, legs, or arms. How is this diagnosed? This condition may be diagnosed based on tests, such as:  CT angiogram. X-ray images of your vertebral arteries are taken. A dye makes the images clear.  MRI angiogram. This is used to check the health of blood vessels.  Cerebral angiogram. X-ray images of blood vessels in the brain and neck are taken. A dye makes the images clear.  Doppler ultrasound. This test creates images using sound waves. It shows how well blood flows through your arteries. How is this treated? Treatment depends on the cause of your condition and on your overall health. The goal of treatment is to prevent a stroke. If you are having a stroke, it is important to  get treatment quickly. Treatment may include:  Blood thinners. This medicine helps to prevent blood clots. This may be given first through an IV, and then as pills for 3-6 months.  Procedures to widen a narrow blood vessel (angioplasty) or to place a mesh tube (stent) inside the blood vessel to keep it open.  Surgery to repair the area. This is rarely needed. Follow these instructions at home: Medicines  Take over-the-counter and prescription medicines only as told by your health care  provider.  If you are taking blood thinners: ? Talk with your health care provider before you take any medicines that contain aspirin or NSAIDs. These medicines increase your risk for dangerous bleeding. ? Take your medicine exactly as told, at the same time every day. ? Avoid activities that could cause injury or bruising. ? Follow instructions about how to prevent falls. ? Wear a medical alert bracelet or carry a card that lists what medicines you take. Lifestyle  Work with your health care provider to control hypertension. This may include: ? Exercising regularly. Check with your health care provider before starting a new type of exercise. ? Eating a heart-healthy diet of fruits, vegetables, whole grains, and lean meats. Limit unhealthy fats and eat more healthy fats such as avocados, eggs, and oily fish. ? Reducing the amount of salt (sodium) that you eat to less than 1,500 mg a day. ? Reducing stress by doing things that you enjoy and avoiding things that cause you stress.  Do not use any products that contain nicotine or tobacco, such as cigarettes, e-cigarettes, and chewing tobacco. If you need help quitting, ask your health care provider.   General instructions  If you drink alcohol: ? Limit how much you use to:  0-1 drink a day for women.  0-2 drinks a day for men. ? Be aware of how much alcohol is in your drink. In the [place] one drink equals one 12 oz bottle of beer (355 mL), one 5 oz glass of wine (148 mL), or one 1 oz of hard liquor (44 mL).  Keep all follow-up visits as told by your health care provider. This is important. Contact a health care provider if you:  Feel weak or dizzy.  Have a fever. Get help right away if:  You have any symptoms of a stroke. "BE FAST" is an easy way to remember the main warning signs of a stroke: ? B - Balance. Signs are dizziness, sudden trouble walking, or loss of balance. ? E - Eyes. Signs are trouble seeing or a sudden change in  vision. ? F - Face. Signs are sudden weakness or numbness of the face, or the face or eyelid drooping on one side. ? A - Arms. Signs are weakness or numbness in an arm. This happens suddenly and usually on one side of the body. ? S - Speech. Signs are sudden trouble speaking, slurred speech, or trouble understanding what people say. ? T - Time. Time to call emergency services. Write down what time symptoms started.  You have other signs of a stroke, such as: ? A sudden, severe headache with no known cause. ? Nausea or vomiting. ? Seizure.  You have other symptoms, such as: ? Difficulty breathing. ? Chest pain. These symptoms may represent a serious problem that is an emergency. Do not wait to see if the symptoms will go away. Get medical help right away. Call your local emergency services (911 in the [place] Do not drive yourself to the  hospital. Summary  Vertebral artery dissection is a tear in an artery that carries blood from the heart to the brain.  Symptoms usually appear within days of an injury, but sometimes they may not appear for weeks or years.  This condition increases the risk for stroke if it is not diagnosed and treated right away.  Treatment depends on the cause of your condition and on your overall health. The goal of treatment is to prevent a stroke.  Get help right away if you have any symptoms of a stroke. This information is not intended to replace advice given to you by your health care provider. Make sure you discuss any questions you have with your health care provider. Document Revised: 09/01/2018 Document Reviewed: 09/01/2018 Elsevier Patient Education  2021 [employer] Inc[employer]    Cerebral Aneurysm  A cerebral aneurysm is a bulge that occurs in a blood vessel (artery) inside the brain. An aneurysm is caused when a weakened part of the blood vessel expands. The blood vessel expands due to the constant pressure from the flow of blood through the weakened blood  vessel. As the aneurysm expands, the walls of the aneurysm become weaker. Aneurysms are dangerous because they can leak or burst (rupture). When a cerebral aneurysm ruptures, it causes bleeding in the brain (subarachnoid hemorrhage). The blood flow to the area of the brain supplied by the artery is also reduced. This can cause a stroke, seizures, or a coma. A ruptured cerebral aneurysm is a medical emergency. This can cause permanent brain damage or death. What are the causes? The exact cause of this condition is not known. What increases the risk? The following factors may make you more likely to develop this condition:  Being older. The condition is most common in people between the ages of 6350 and 6160.  Being female.  Having a family history of aneurysm in two or more direct relatives.  Having certain conditions that are passed along from parent to child (inherited). They include: ? Autosomal dominant polycystic kidney disease. This is a condition in which small, fluid-filled sacs (cysts) develop in the kidney. ? Neurofibromatosis type 1. In this condition, flat spots develop under the skin (pigmentation) and tumors grow along nerves in the skin, brain, and other parts of the body. ? Ehlers-Danlos syndrome. This is a condition in which bad connective tissue causes loose or unstable joints and creates a very soft skin that bruises or tears easily.  Smoking.  Having high blood pressure (hypertension).  Abusing alcohol. What are the signs or symptoms? The symptoms of a cerebral aneurysm that has not leaked or ruptured can depend on its size and rate of growth. A small, unchanging aneurysm generally does not cause symptoms. A larger aneurysm that is steadily growing can increase pressure on the brain or nerves. This increased pressure can cause:  A headache.  Vision problems.  Numbness or weakness in an arm or leg.  Memory problems.  Problems speaking.  Seizures. If an aneurysm  leaks or ruptures, it can cause a life-threatening condition, such as a stroke. Symptoms may include:  A sudden, severe headache with no known cause. The headache is often described as the worst headache ever experienced.  Stiff neck.  Nausea or vomiting, especially when combined with other symptoms, such as a headache.  Sudden weakness or numbness of the face, arm, or leg, especially on one side of the body.  Sudden trouble walking or difficulty moving the arms or legs.  Double vision or sudden  trouble seeing in one or both eyes.  Trouble speaking or understanding speech.  Trouble swallowing.  Dizziness.  Loss of balance or coordination.  Intolerance to light.  Sudden confusion or loss of consciousness. How is this diagnosed? This condition is diagnosed using certain tests, including:  CT scan.  Computed tomographic angiogram (CTA). This test uses a dye and a scanner to produce images of your blood vessels.  Magnetic resonance angiogram (MRA). This test uses an MRI machine to produce images of your blood vessels.  Digital subtraction angiogram (DSA). This test involves placing a long, thin tube (catheter) into the artery in your thigh and guiding it up to the arteries in the brain. A dye is then injected into the area, and X-rays are taken to create images of your blood vessels. How is this treated? Unruptured aneurysm Treatment for an aneurysm that is not causing problems will depend on many factors, such as the size and location of the aneurysm, your age, your overall health, and your preferences. Small aneurysms in certain locations of the brain have a very low chance of bleeding or rupturing. These small aneurysms may not need to be treated. Your health care provider may monitor the aneurysm regularly to check for any changes. In some cases, however, treatment may be required because of the size or location of an aneurysm. Treatment options may include:  Coiling. During  this procedure, a catheter is inserted and advanced through a blood vessel. Once the catheter reaches the aneurysm, tiny coils are used to block blood flow into the aneurysm. This procedure is sometimes done at the same time as a DSA.  Surgical clipping. During surgery, a clip is placed at the base of the aneurysm. The clip prevents blood from continuing to enter the aneurysm.  Flow diversion. This procedure is used to divert blood flow around the aneurysm with a stent that is placed across the opening of an aneurysm. Ruptured aneurysm For a ruptured aneurysm, emergency surgery or coiling is often needed right away to help prevent damage to the brain and to reduce the risk of rebleeding. Follow these instructions at home: If your aneurysm is not treated:  Take over-the-counter and prescription medicines only as told by your health care provider.  Follow a diet suggested by your health care provider. Certain dietary changes may be advised to address hypertension, such as choosing foods that are low in salt (sodium), saturated fat, trans fat, and cholesterol.  Stay physically active. Try to get at least 30 minutes of activity on most or all days of the week.  Do not use any products that contain nicotine or tobacco. These products include cigarettes, chewing tobacco, and vaping devices, such as e-cigarettes. If you need help quitting, ask your health care provider.  If you drink alcohol: ? Limit how much you have to:  0-1 drink a day for women who are not pregnant.  0-2 drinks a day for men. ? Know how much alcohol is in your drink. In the [place] one drink equals one 12 oz bottle of beer (355 mL), one 5 oz glass of wine (148 mL), or one 1 oz glass of hard liquor (44 mL).  Do not use drugs. If you need help quitting, ask your health care provider.  Keep all follow-up visits. This is important. This includes any referrals, imaging studies, and lab tests. Proper follow-up may prevent an  aneurysm rupture or a stroke. Get help right away if:  You have a sudden, severe headache  with no known cause. This may include a stiff neck.  You have sudden nausea or vomiting with a severe headache.  You have a seizure.  You have other symptoms of a stroke. "BE FAST" is an easy way to remember the main warning signs of a stroke: ? B - Balance. Signs are dizziness, sudden trouble walking, or loss of balance. ? E - Eyes. Signs are trouble seeing or a sudden change in vision. ? F - Face. Signs are sudden weakness or numbness of the face, or the face or eyelid drooping on one side. ? A - Arms. Signs are weakness or numbness in an arm. This happens suddenly and usually on one side of the body. ? S - Speech. Signs are sudden trouble speaking, slurred speech, or trouble understanding what people say. ? T - Time. Time to call emergency services. Write down what time symptoms started. These symptoms may represent a serious problem that is an emergency. Do not wait to see if the symptoms will go away. Get medical help right away. Call your local emergency services (911 in the [place] Do not drive yourself to the hospital. Summary  A cerebral aneurysm is a bulge that occurs in a blood vessel (artery) inside the brain.  Aneurysms are dangerous because they can leak or burst (rupture). When a cerebral aneurysm ruptures, it causes bleeding in the brain.  Treatment depends on many factors, including the size and location of the aneurysm and whether it is ruptured. A ruptured aneurysm is a medical emergency.  Get help right away if you have symptoms of a stroke. "BE FAST" is an easy way to remember the main warning signs of a stroke. This information is not intended to replace advice given to you by your health care provider. Make sure you discuss any questions you have with your health care provider. Document Revised: 08/27/2020 Document Reviewed: 08/27/2020 Elsevier Patient Education  2021 [employer].

## 2021-05-06 NOTE — Progress Notes (Signed)
I agree with the above plan 

## 2021-05-06 NOTE — Therapy (Signed)
[facility] [address] [place], [place], [address] Phone: [phone]   Fax:  [phone]  Physical Therapy Treatment  Patient Details  Name: Caroline Graham MRN: [id number] Date of Birth: 1991-10-08 Referring Provider (PT): Ihor Austin, NP)   Encounter Date: 05/06/2021   PT End of Session - 05/06/21 1019    Visit Number 6    Number of Visits 13    Authorization Type U[employer]-90 combined visits PT, OT, speech    PT Start Time 1019    PT Stop Time 1101    PT Time Calculation (min) 42 min    Activity Tolerance Patient tolerated treatment well   slight increase in symptoms   Behavior During Therapy [facility] for tasks assessed/performed           No past medical history on file.  Past Surgical History:  Procedure Laterality Date  . KNEE SURGERY    . TONSILLECTOMY      There were no vitals filed for this visit.   Subjective Assessment - 05/06/21 1020    Subjective Had a few instances where I had too much stimulation; went to [employer] and looked around and it took a long time to come down from that.  Off for about an hour.  Still have occasional headaches and sometimes nauseousness.  In about mid-July, they will redo the scan to make sure the dissection is healing.    Pertinent History Left vertebral Artery Dissection    Patient Stated Goals Pt's goals for PT are to regain coordination and balance; build more confidence.    Currently in Pain? Yes    Pain Score 4     Pain Location Head    Pain Orientation Upper    Pain Descriptors / Indicators Headache;Dull    Pain Type Acute pain    Pain Onset 1 to 4 weeks ago    Pain Frequency Constant    Aggravating Factors  certain movements-busy environments    Pain Relieving Factors rest, tylenol                             [facility] Adult PT Treatment/Exercise - 05/06/21 0001      Ambulation/Gait   Ambulation/Gait Yes    Ambulation/Gait Assistance 6:  Modified independent (Device/Increase time)    Ambulation Distance (Feet) 1000 Feet    Assistive device None    Gait Pattern Step-through pattern    Ambulation Surface Unlevel;Outdoor;Paved    Gait Comments Gait on outdoor surfaces, gentle grade inclines/declines on paved surfaces, approx 7 minutes.  Pt with no LOB and educated pt she can practice gait outdoors at home with family with her.  BP measured after gait:  118/71, HR 93.               Balance Exercises - 05/06/21 0001      Balance Exercises: Standing   Standing Eyes Opened Foam/compliant surface;Wide (BOA);Narrow base of support (BOS);Limitations    Standing Eyes Opened Limitations On Airex, head turns x 5, head nods x 5   Rates dizziness 3/10 on foam horizontal head motions   Gait with Head Turns Forward;3 reps;Limitations    Gait with Head Turns Limitations In hallway, slight veering to the L; cues to use visual targets each side and in middle to assist with less veering (noted less veering with this trial)    Marching Foam/compliant surface;Intermittent upper extremity assist;Dynamic;Static;10 reps   2 sets  Other Standing Exercises On Airex:  alt forward kicks x 10 reps, 2 sets with intermittent UE support    Other Standing Exercises Comments Gaze stabilization looking at target, 5 reps, then 30 seconds standing narrow BOS, symptoms rated as 1/10.  Repeated 30 seconds, pt standing on Airex.  Progressed to standing on incline/decline of ramp:  marching in place x10, alt forward kicks x 10, gentle minisquats x 10 with min guard.  Pt with increased symptoms rating to 3/10 with activities standing on incline.            Access Code: [email or web address] URL: [email or web address] Date: 05/06/2021 Prepared by: Lonia Blood  Exercises-reviewed as HEP given last visit  . Standing Balance in Corner - 1-2 x daily - 7 x weekly - 1-2 sets - 10 reps (gentle head turns/nods), feet apart on solid surface . Standing Balance in  Corner with Eyes Closed - 1-2 x daily - 7 x weekly - 1 sets - 10 reps, (gentle head turns/nods), feet apart on solid surface    PT Education - 05/06/21 1111    Education Details Addition to HEP-see instructions    Person(s) Educated Patient    Methods Explanation;Demonstration;Handout    Comprehension Verbalized understanding            PT Short Term Goals - 04/16/21 1854      PT SHORT TERM GOAL #1   Title Pt will be independent with HEP for improved strength, balance, transfers, and gait.  TARGET 05/09/2021    Time 4    Period Weeks    Status New      PT SHORT TERM GOAL #2   Title Pt will improve FGA score to at least 26/30 to decrease fall risk.    Baseline 23/30    Time 4    Period Weeks    Status New      PT SHORT TERM GOAL #3   Title Pt will ambulate at least 10 minutes on level, unlevel surfaces with no increase in reported unsteadiness/vestibular symptoms, for improved activity tolerance for return to leisure, work activities.    Time 4    Period Weeks    Status New      PT SHORT TERM GOAL #4   Title Pt will negotiate at least 12 steps, alternating pattern, no handrail, independnetly for improved stair negotiation.    Time 4    Period Weeks    Status New             PT Long Term Goals - 04/16/21 1857      PT LONG TERM GOAL #1   Title Pt will be independent with HEP for improved strength, balance, transfers, and gait.  TARGET 06/06/2021    Time 8    Period Weeks    Status New      PT LONG TERM GOAL #2   Title Pt will improve FGA score to at least 28/30 to decrease fall risk.    Time 8    Period Weeks    Status New      PT LONG TERM GOAL #3   Title Pt will perform at least 20 minutes of continuous exercise/activity/gait tasks with no reported increase in vestibular symptoms.    Time 8    Period Weeks    Status New      PT LONG TERM GOAL #4   Title FOTO score for lower extremity to improve to at least 77 for improve overall return to PLOF.  Time  8    Period Weeks    Status New                 Plan - 05/06/21 1150    Clinical Impression Statement Pt progressing to performing head motions and varied surfaces in therapy session as well as longer distance outdoor surfaces, with no more than 3/10 symptom rating.  Pt does note that over the weekend, she had extended symptoms (> 1 hour) with attempt to go shopping at [facility].  In addition, she has had several episodes of nausea with increased headache.  She will continue to benefit from skilled PT to progress balance and vestibular system rehab.    Personal Factors and Comorbidities Comorbidity 1    Comorbidities hx of knee surgery (L)    Examination-Activity Limitations Locomotion Level;Transfers;Squat;Carry    Examination-Participation Restrictions Community Activity;Driving    Stability/Clinical Decision Making Stable/Uncomplicated    Rehab Potential Good    PT Frequency Other (comment)   2x/wk for 4 weeks, then 1x/wk for 4 weeks, after eval   PT Duration Other (comment)   total POC = 8 weeks   PT Treatment/Interventions ADLs/Self Care Home Management;Gait training;Stair training;Functional mobility training;Therapeutic activities;Therapeutic exercise;Balance training;Neuromuscular re-education;Patient/family education;Vestibular    PT Next Visit Plan Review HEP updates and continue to work on balance- including high level balance, compliant surface, gentle head and body motions; habiutation ex's.  Progress to busier backgrounds with gaze stabilization.  How did outdoor gait go at home?    Consulted and Agree with Plan of Care Patient           Patient will benefit from skilled therapeutic intervention in order to improve the following deficits and impairments:  Abnormal gait,Decreased mobility,Decreased strength,Dizziness,Difficulty walking,Decreased activity tolerance  Visit Diagnosis: Dizziness and giddiness  Other abnormalities of gait and mobility  Unsteadiness on  feet     Problem List Patient Active Problem List   Diagnosis Date Noted  . 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 05/06/2021, 11:54 AM  Gean Maidens., PT   [facility] [address] [place], [place], [address] Phone: [phone]   Fax:  [phone]  Name: Caroline Graham MRN: [id number] Date of Birth: 03/03/1991

## 2021-05-08 ENCOUNTER — Encounter: Payer: Self-pay | Admitting: Occupational Therapy

## 2021-05-08 ENCOUNTER — Other Ambulatory Visit: Payer: Self-pay

## 2021-05-08 ENCOUNTER — Telehealth: Payer: Self-pay | Admitting: Adult Health

## 2021-05-08 ENCOUNTER — Encounter: Payer: Self-pay | Admitting: Physical Therapy

## 2021-05-08 ENCOUNTER — Ambulatory Visit: Payer: 59 | Admitting: Occupational Therapy

## 2021-05-08 ENCOUNTER — Ambulatory Visit: Payer: 59 | Admitting: Physical Therapy

## 2021-05-08 DIAGNOSIS — M6281 Muscle weakness (generalized): Secondary | ICD-10-CM

## 2021-05-08 DIAGNOSIS — R41840 Attention and concentration deficit: Secondary | ICD-10-CM

## 2021-05-08 DIAGNOSIS — R2689 Other abnormalities of gait and mobility: Secondary | ICD-10-CM

## 2021-05-08 DIAGNOSIS — R42 Dizziness and giddiness: Secondary | ICD-10-CM

## 2021-05-08 DIAGNOSIS — R278 Other lack of coordination: Secondary | ICD-10-CM

## 2021-05-08 DIAGNOSIS — R2681 Unsteadiness on feet: Secondary | ICD-10-CM

## 2021-05-08 DIAGNOSIS — I69354 Hemiplegia and hemiparesis following cerebral infarction affecting left non-dominant side: Secondary | ICD-10-CM

## 2021-05-08 NOTE — Therapy (Signed)
[facility] [address] [place], [place], [address] Phone: [phone]   Fax:  [phone]  Physical Therapy Treatment  Patient Details  Name: Caroline Graham MRN: [id number] Date of Birth: 27-May-1991 Referring Provider (PT): Ihor Austin, NP)   Encounter Date: 05/08/2021   PT End of Session - 05/08/21 1536    Visit Number 7    Number of Visits 13    Authorization Type [employer] combined visits PT, OT, speech    PT Start Time 1533    PT Stop Time 1615    PT Time Calculation (min) 42 min    Activity Tolerance Patient tolerated treatment well   slight increase in symptoms   Behavior During Therapy [facility] for tasks assessed/performed           History reviewed. No pertinent past medical history.  Past Surgical History:  Procedure Laterality Date  . KNEE SURGERY    . TONSILLECTOMY      There were no vitals filed for this visit.   Subjective Assessment - 05/08/21 1533    Subjective Started Topamax Tues night at neurologist orders. Hoping this will help with her headaches and fatigue.    Pertinent History Left vertebral Artery Dissection    Patient Stated Goals Pt's goals for PT are to regain coordination and balance; build more confidence.    Currently in Pain? Yes    Pain Score 4     Pain Location Head    Pain Orientation Upper    Pain Descriptors / Indicators Headache;Dull    Pain Type Acute pain    Pain Onset 1 to 4 weeks ago    Pain Frequency Constant    Aggravating Factors  certain movements, busy enviroments    Pain Relieving Factors rest, tylenol           ]    [facility] Adult PT Treatment/Exercise - 05/08/21 1536      Transfers   Transfers Sit to Stand;Stand to Sit    Sit to Stand 6: Modified independent (Device/Increase time);Without upper extremity assist;From chair/3-in-1    Stand to Sit 6: Modified independent (Device/Increase time);Without upper extremity assist;To chair/3-in-1       Ambulation/Gait   Ambulation/Gait Yes    Ambulation/Gait Assistance 6: Modified independent (Device/Increase time)    Ambulation/Gait Assistance Details had pt scanning all directions with gait outdoors randomly with no issues noted or reported.    Ambulation Distance (Feet) 1000 Feet   x1   Assistive device None    Gait Pattern Step-through pattern    Ambulation Surface Level;Indoor;Unlevel;Outdoor;Paved      Neuro Re-ed    Neuro Re-ed Details  for balance/NMR/habituation: gait around track while tracking stimulating ball left<>right, then circles CW, then CCW for 1 lap each with min guard/supervision assist for balance. Pt reported most symptoms with CCW that resolved quickly after the activity stopped; then with blue mat over ramp- performed both facing up, then down the ramp with feet together for EC 30 sec's x3 reps, then EC for head movements left<>right, up<>down for ~10 reps each. Min guard assist with no symptoms provoked/reported; blue mat on level floor- had pt perform forward fold <> move ball in a figure 8 around legs <> back up with 90* turn for 5 reps toward each side. minimal symptoms reported with 1st and 2cd reps, none after that.               Balance Exercises - 05/08/21 1603  Balance Exercises: Standing   Other Standing Exercises inverted BOSU with no UE support: rocking fwd/bwd, then laterally for ~10 reps each with emphasis on tall posture; then holding it steady for EC 30 sec's x 3 reps, then with EC head movements left<>right, up<>down for ~-10 reps each. min guard to min assist with mostly posterior loss of balance noted.               PT Short Term Goals - 04/16/21 1854      PT SHORT TERM GOAL #1   Title Pt will be independent with HEP for improved strength, balance, transfers, and gait.  TARGET 05/09/2021    Time 4    Period Weeks    Status New      PT SHORT TERM GOAL #2   Title Pt will improve FGA score to at least 26/30 to decrease fall risk.     Baseline 23/30    Time 4    Period Weeks    Status New      PT SHORT TERM GOAL #3   Title Pt will ambulate at least 10 minutes on level, unlevel surfaces with no increase in reported unsteadiness/vestibular symptoms, for improved activity tolerance for return to leisure, work activities.    Time 4    Period Weeks    Status New      PT SHORT TERM GOAL #4   Title Pt will negotiate at least 12 steps, alternating pattern, no handrail, independnetly for improved stair negotiation.    Time 4    Period Weeks    Status New             PT Long Term Goals - 04/16/21 1857      PT LONG TERM GOAL #1   Title Pt will be independent with HEP for improved strength, balance, transfers, and gait.  TARGET 06/06/2021    Time 8    Period Weeks    Status New      PT LONG TERM GOAL #2   Title Pt will improve FGA score to at least 28/30 to decrease fall risk.    Time 8    Period Weeks    Status New      PT LONG TERM GOAL #3   Title Pt will perform at least 20 minutes of continuous exercise/activity/gait tasks with no reported increase in vestibular symptoms.    Time 8    Period Weeks    Status New      PT LONG TERM GOAL #4   Title FOTO score for lower extremity to improve to at least 77 for improve overall return to PLOF.    Time 8    Period Weeks    Status New                 Plan - 05/09/21 0135    Clinical Impression Statement Today's skilled session continued to focus on dynamic gain on various surfaces and balance training with increased vestibuar system input with no significant issues noted or reported. The pt is progressing toward goals and should benefi from continued PT to progreess toward unmet goals.    PT Next Visit Plan Review HEP updates and continue to work on balance- including high level balance, compliant surface, gentle head and body motions; habiutation ex's.  Progress to busier backgrounds with gaze stabilization.  How did outdoor gait go at home?            Patient will benefit from skilled therapeutic  intervention in order to improve the following deficits and impairments:  Abnormal gait,Decreased mobility,Decreased strength,Dizziness,Difficulty walking,Decreased activity tolerance  Visit Diagnosis: Dizziness and giddiness  Other abnormalities of gait and mobility  Unsteadiness on feet  Muscle weakness (generalized)     Problem List Patient Active Problem List   Diagnosis Date Noted  . Cerebellar infarct [facility]) 04/09/2021    Sallyanne Kuster, PTA, [facility] [address], [address] [place], [place] [address] [phone] 05/09/21, 1:42 AM   Name: Caroline Graham MRN: [id number] Date of Birth: [birth year]

## 2021-05-08 NOTE — Telephone Encounter (Signed)
Called pt to get payment for her Caroline Graham disability form to be filled out and was informed I could throw it away since she is having it filled out by PCP.

## 2021-05-08 NOTE — Therapy (Signed)
[facility] [address] [place], [place], [address] Phone: [phone]   Fax:  [phone]  Occupational Therapy Treatment  Patient Details  Name: ALIZAYA OSHEA MRN: [id number] Date of Birth: 1991/05/07 Referring Provider (OT): follows up with Ihor Austin, NP   Encounter Date: 05/08/2021   OT End of Session - 05/08/21 1617    Visit Number 4    Number of Visits 9    Date for OT Re-Evaluation 06/11/21    Authorization Type [facility] 2022    Authorization Time Period $30 copay per discipline 90 ST/OT/PT    OT Start Time 1616    OT Stop Time 1700    OT Time Calculation (min) 44 min    Activity Tolerance Patient tolerated treatment well    Behavior During Therapy [facility] for tasks assessed/performed           History reviewed. No pertinent past medical history.  Past Surgical History:  Procedure Laterality Date  . KNEE SURGERY    . TONSILLECTOMY      There were no vitals filed for this visit.   Subjective Assessment - 05/08/21 1617    Subjective  I've got a bit of a headache (3)    Pertinent History PMH: arterial dissection, CVA    Patient Stated Goals to get back to my baseline, safely use knife    Currently in Pain? Yes    Pain Score 3     Pain Location Head    Pain Orientation Upper    Pain Descriptors / Indicators Headache    Pain Type Acute pain    Pain Onset 1 to 4 weeks ago    Pain Frequency Constant    Aggravating Factors  visually straining    Pain Relieving Factors rest, tylenol            Pt reports some symptoms after doing "screen work" in prep for work on [employer] and looking down - neck pain and increased headache.  Hand Gripper: with LUE on level 4 with black spring. Pt picked up 1 inch blocks with gripper with mod drops and mod difficulty. Downgraded to level 3 with black spring.  Deductive Reasoning/Logic Puzzle with no difficulty and solved in appropriate time.    Typing  Test/Master for simulating more work related activities of being on a computer for more time, copying near point paragraphs, words, and attending to computer type activities. Pt reported eye strain at end of activity (approx 5 minutes).  [place] copying of small peg board design on elevated surface for visual movement   Constant Therapy Read a Map level 2 with 100% accuracy and 41.53s response time.                  OT Short Term Goals - 04/30/21 1621      OT SHORT TERM GOAL #1   Title Pt will be independent with HEP for grip strength    Time 4    Period Weeks    Status Achieved   issued red theraputty 04/23/21   Target Date 05/15/21      OT SHORT TERM GOAL #2   Title Pt will verbalize understanding of adapted strategies for increasing safety awareness and independence with ADLs and IADLs (cutting vegetables, fastening bra, etc)    Time 4    Period Weeks    Status Achieved      OT SHORT TERM GOAL #3   Title Pt will increase grip strength in  LUE by 5 lbs for in order to increase functional use in non dominant hand.    Baseline R 79.3 L 59    Time 4    Period Weeks    Status Achieved   72.5 lbs with LUE 04/30/21     OT SHORT TERM GOAL #4   Title Pt will complete 66 blocks or greater in Box and Blocks with LUE without hitting the divider d/t undershooting.    Baseline 61 with L with undershooting approx 5 times.    Time 4    Period Weeks    Status Achieved   67 blocks with L 04/30/21            OT Long Term Goals - 04/30/21 1626      OT LONG TERM GOAL #1   Title Pt will increase grip strength in LUE by 10 lbs in order to increase functional use in non dominant hand.    Baseline R 79.3, L 59    Time 8    Period Weeks    Status On-going   72.5 lbs     OT LONG TERM GOAL #2   Title Pt will perform physical and cognitive task simultaneously with 100% accuracy    Time 8    Period Weeks    Status Achieved   subtraction while in hand manipulation and ambulating  100%     OT LONG TERM GOAL #3   Title Pt will verbalize understanding of return to driving recommendations    Time 8    Period Weeks    Status Achieved   pt not ready for driving at this time. Issued return to driving suggestions with physician's approval.     OT LONG TERM GOAL #4   Title Pt will perform work simulated tasks with 95% accuracy for prep for return to work.    Time 8    Period Weeks    Status On-going   pt not returning to work at this time. on hold until sees neurologist     OT LONG TERM GOAL #5   Title Pt will complete UE FOTO at discharge.    Time 8    Period Weeks    Status New                 Plan - 05/08/21 1706    Clinical Impression Statement Pt continues to progress towards goals. Pt continues to have some increased symptoms with visually stimulating activities    OT Occupational Profile and History Problem Focused Assessment - Including review of records relating to presenting problem    Occupational performance deficits (Please refer to evaluation for details): Work;Leisure;IADL's;ADL's    Body Structure / Function / Physical Skills ADL;IADL;FMC;Vision;UE functional use;Strength;Decreased knowledge of use of DME;Coordination;Dexterity;GMC    Cognitive Skills Attention    Rehab Potential Good    Clinical Decision Making Limited treatment options, no task modification necessary    Comorbidities Affecting Occupational Performance: None    Modification or Assistance to Complete Evaluation  No modification of tasks or assist necessary to complete eval    OT Frequency 1x / week    OT Duration 8 weeks   may d/c early based on progress.   OT Treatment/Interventions Self-care/ADL training;DME and/or AE instruction;Moist Heat;Therapeutic activities;Patient/family education;Neuromuscular education;Functional Mobility Training;Visual/perceptual remediation/compensation;Cognitive remediation/compensation;Therapeutic exercise    Plan work more towards visually  busy tasks, screens etc. for work simulated.    Consulted and Agree with Plan of Care Patient  Patient will benefit from skilled therapeutic intervention in order to improve the following deficits and impairments:   Body Structure / Function / Physical Skills: ADL,IADL,FMC,Vision,UE functional use,Strength,Decreased knowledge of use of DME,Coordination,Dexterity,GMC Cognitive Skills: Attention     Visit Diagnosis: Other abnormalities of gait and mobility  Unsteadiness on feet  Muscle weakness (generalized)  Other lack of coordination  Hemiplegia and hemiparesis following cerebral infarction affecting left non-dominant side (HCC)  Attention and concentration deficit    Problem List Patient Active Problem List   Diagnosis Date Noted  . Cerebellar infarct [facility]) 04/09/2021    Junious Dresser MOT, OTR/L  05/08/2021, 5:06 PM  [facility] [address] [place], [place], [address] Phone: [phone]   Fax:  [phone]  Name: MARCIANA UPLINGER MRN: [id number] Date of Birth: Apr 24, 1991

## 2021-05-15 ENCOUNTER — Other Ambulatory Visit: Payer: Self-pay

## 2021-05-15 ENCOUNTER — Encounter: Payer: Self-pay | Admitting: Physical Therapy

## 2021-05-15 ENCOUNTER — Ambulatory Visit: Payer: 59 | Admitting: Occupational Therapy

## 2021-05-15 ENCOUNTER — Ambulatory Visit: Payer: 59 | Admitting: Physical Therapy

## 2021-05-15 ENCOUNTER — Encounter: Payer: Self-pay | Admitting: Occupational Therapy

## 2021-05-15 ENCOUNTER — Encounter: Payer: Self-pay | Admitting: Adult Health

## 2021-05-15 DIAGNOSIS — R2689 Other abnormalities of gait and mobility: Secondary | ICD-10-CM

## 2021-05-15 DIAGNOSIS — I69354 Hemiplegia and hemiparesis following cerebral infarction affecting left non-dominant side: Secondary | ICD-10-CM

## 2021-05-15 DIAGNOSIS — R42 Dizziness and giddiness: Secondary | ICD-10-CM

## 2021-05-15 DIAGNOSIS — R278 Other lack of coordination: Secondary | ICD-10-CM

## 2021-05-15 DIAGNOSIS — M6281 Muscle weakness (generalized): Secondary | ICD-10-CM

## 2021-05-15 DIAGNOSIS — R2681 Unsteadiness on feet: Secondary | ICD-10-CM

## 2021-05-15 DIAGNOSIS — R41840 Attention and concentration deficit: Secondary | ICD-10-CM

## 2021-05-15 MED ORDER — TOPIRAMATE 50 MG PO TABS: 50.0000 mg | ORAL_TABLET | Freq: Every day | ORAL | 5 refills | Status: DC

## 2021-05-15 NOTE — Therapy (Signed)
[place] [address] [place], [place], [address] Phone: [phone]   Fax:  [phone]  Occupational Therapy Treatment & Discharge  Patient Details  Name: Caroline Graham MRN: [id number] Date of Birth: Feb 06, 1991 Referring Provider (OT): follows up with Frann Rider, NP   Encounter Date: 05/15/2021   OT End of Session - 05/15/21 1613    Visit Number 5    Number of Visits 9    Date for OT Re-Evaluation 06/11/21    Authorization Type [facility] 2022    Authorization Time Period $30 copay per discipline 90 ST/OT/PT    OT Start Time 1613    OT Stop Time 1700    OT Time Calculation (min) 47 min    Activity Tolerance Patient tolerated treatment well    Behavior During Therapy [facility] for tasks assessed/performed           History reviewed. No pertinent past medical history.  Past Surgical History:  Procedure Laterality Date  . KNEE SURGERY    . TONSILLECTOMY      There were no vitals filed for this visit.   Subjective Assessment - 05/15/21 1614    Subjective  "I would give it like a 1 right now." Pt reports not planning to return to work until October now.    Pertinent History PMH: arterial dissection, CVA    Patient Stated Goals to get back to my baseline, safely use knife    Currently in Pain? Yes    Pain Score 1     Pain Location Head    Pain Orientation Upper    Pain Descriptors / Indicators Headache    Pain Type Acute pain    Pain Onset 1 to 4 weeks ago    Pain Frequency Constant             Environmental Scanning for addressing head turns and more visually stimulating task with 15/16 accuracy = 94% on first pass.  Hand Gripper: with LUE on level 4 with black spring. Pt picked up 1 inch blocks with gripper with min drops and min difficulty. Downgraded to level 3 after approximately 75% of blocks.   Constant Therapy Alternating Symbols level 7 with 98% accuracy and 36.4s response  time.    OCCUPATIONAL THERAPY DISCHARGE SUMMARY  Visits from Start of Care: 5  Current functional level related to goals / functional outcomes: Pt independent with all ADLs and IADLs. Functional gains with strength and coordination with LUE.    Remaining deficits: Some disturbance with visually stimulating tasks.   Education / Equipment: HEPs for grip strength and coordination  Plan: Patient agrees to discharge.  Patient goals were met. Patient is being discharged due to meeting the stated rehab goals.  ?????                     OT Short Term Goals - 04/30/21 1621      OT SHORT TERM GOAL #1   Title Pt will be independent with HEP for grip strength    Time 4    Period Weeks    Status Achieved   issued red theraputty 04/23/21   Target Date 05/15/21      OT SHORT TERM GOAL #2   Title Pt will verbalize understanding of adapted strategies for increasing safety awareness and independence with ADLs and IADLs (cutting vegetables, fastening bra, etc)    Time 4    Period Weeks    Status Achieved  OT SHORT TERM GOAL #3   Title Pt will increase grip strength in LUE by 5 lbs for in order to increase functional use in non dominant hand.    Baseline R 79.3 L 59    Time 4    Period Weeks    Status Achieved   72.5 lbs with LUE 04/30/21     OT SHORT TERM GOAL #4   Title Pt will complete 66 blocks or greater in Box and Blocks with LUE without hitting the divider d/t undershooting.    Baseline 61 with L with undershooting approx 5 times.    Time 4    Period Weeks    Status Achieved   67 blocks with L 04/30/21            OT Long Term Goals - 05/15/21 1650      OT LONG TERM GOAL #1   Title Pt will increase grip strength in LUE by 10 lbs in order to increase functional use in non dominant hand.    Baseline R 79.3, L 59    Time 8    Period Weeks    Status Not Met   58.6 lbs - 05/15/21 - 72 lbs previous reading     OT LONG TERM GOAL #2   Title Pt will perform  physical and cognitive task simultaneously with 100% accuracy    Time 8    Period Weeks    Status Achieved   subtraction while in hand manipulation and ambulating 100%     OT LONG TERM GOAL #3   Title Pt will verbalize understanding of return to driving recommendations    Time 8    Period Weeks    Status Achieved   pt not ready for driving at this time. Issued return to driving suggestions with physician's approval.     OT LONG TERM GOAL #4   Title Pt will perform work simulated tasks with 95% accuracy for prep for return to work.    Time 8    Period Weeks    Status Deferred   pt not returning to work at this time. considering not until october.     OT LONG TERM GOAL #5   Title Pt will complete UE FOTO at discharge.    Time 8    Period Weeks    Status Achieved   scored 91% at discharge.                Plan - 05/15/21 1642    Clinical Impression Statement Pt is progressing towards goals. Pt has postponed back to work - continuing to work towards increased tolerance of visually stimulating tasks. Pt is ready to discharge from OT at tihs time. Pt has met all STGs and all LTGs with exception of work simulated goal - pt not going back to work at this time. Pt did not meet grip strength goal, however, demonstrating improved grip strength and tested after fatigued with grip strength.    OT Occupational Profile and History Problem Focused Assessment - Including review of records relating to presenting problem    Occupational performance deficits (Please refer to evaluation for details): Work;Leisure;IADL's;ADL's    Body Structure / Function / Physical Skills ADL;IADL;FMC;Vision;UE functional use;Strength;Decreased knowledge of use of DME;Coordination;Dexterity;GMC    Cognitive Skills Attention    Rehab Potential Good    Clinical Decision Making Limited treatment options, no task modification necessary    Comorbidities Affecting Occupational Performance: None    Modification or  Assistance  to Complete Evaluation  No modification of tasks or assist necessary to complete eval    OT Frequency 1x / week    OT Duration 8 weeks   may d/c early based on progress.   OT Treatment/Interventions Self-care/ADL training;DME and/or AE instruction;Moist Heat;Therapeutic activities;Patient/family education;Neuromuscular education;Functional Mobility Training;Visual/perceptual remediation/compensation;Cognitive remediation/compensation;Therapeutic exercise    Plan discharge OT    Consulted and Agree with Plan of Care Patient           Patient will benefit from skilled therapeutic intervention in order to improve the following deficits and impairments:   Body Structure / Function / Physical Skills: ADL,IADL,FMC,Vision,UE functional use,Strength,Decreased knowledge of use of DME,Coordination,Dexterity,GMC Cognitive Skills: Attention     Visit Diagnosis: Muscle weakness (generalized)  Other lack of coordination  Hemiplegia and hemiparesis following cerebral infarction affecting left non-dominant side (HCC)  Attention and concentration deficit  Unsteadiness on feet    Problem List Patient Active Problem List   Diagnosis Date Noted  . 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

## 2021-05-20 NOTE — Therapy (Signed)
[facility] [address] [place], [place], [address] Phone: [phone]   Fax:  [phone]  Physical Therapy Treatment  Patient Details  Name: Caroline Graham MRN: [id number] Date of Birth: 1990-12-22 Referring Provider (PT): Frann Rider, NP)   Encounter Date: 05/15/2021   05/15/21 1544  PT Visits / Re-Eval  Visit Number 8  Number of Visits 13  Authorization  Authorization Type U[employer]-90 combined visits PT, OT, speech  PT Time Calculation  PT Start Time 1536  PT Stop Time 1615  PT Time Calculation (min) 39 min  PT - End of Session  Activity Tolerance Patient tolerated treatment well (slight increase in symptoms)  Behavior During Therapy [facility] for tasks assessed/performed     History reviewed. No pertinent past medical history.  Past Surgical History:  Procedure Laterality Date  . KNEE SURGERY    . TONSILLECTOMY      There were no vitals filed for this visit.      05/15/21 1538  Symptoms/Limitations  Subjective Saw her PCP and is now off the Lipator which was causing her joint pain. The pain has greatly improved and goes in September for lab work to check her Cholesterol. Has also reached out to Neuro about Topamax not helping with headaches/fatigue. Just heard back from them and will increase to 50 mg, tonight being the 1st dose of the higher dosage. Has also discovered only one outing at at time for her right now after going to [place], then shopping at [employer]. Was facing the busy resturant which made her feel bad. Then after shopping was standing in line and began to feel aweful and left without her shirts. Went home and laid down in a dark room which helped her symptoms improve. Has been walking outside at home with mostly no issues, still has a little sway when she looks left.  Pertinent History Left vertebral Artery Dissection  Patient Stated Goals Pt's goals for PT are to regain  coordination and balance; build more confidence.  Pain Assessment  Currently in Pain? Yes  Pain Score 2  Pain Location Head  Pain Orientation Upper  Pain Descriptors / Indicators Headache  Pain Type Acute pain  Pain Onset 1 to 4 weeks ago  Pain Frequency Constant  Aggravating Factors  visually straining, busy enviroment  Pain Relieving Factors rest, tylenol, dark rooms         05/15/21 1549  Functional Gait  Assessment  Gait assessed  Yes  Gait Level Surface 3 (4.90 sec's)  Change in Gait Speed 3  Gait with Horizontal Head Turns 3  Gait with Vertical Head Turns 3  Gait and Pivot Turn 3  Step Over Obstacle 3  Gait with Narrow Base of Support 3  Gait with Eyes Closed 3 (6.34 sec's)  Ambulating Backwards 3 (12.91)  Steps 3  Total Score 30  FGA comment: 30/30 scored today       05/15/21 1549  Transfers  Transfers Sit to Stand;Stand to Sit  Sit to Stand 6: Modified independent (Device/Increase time);Without upper extremity assist;From chair/3-in-1  Stand to Sit 6: Modified independent (Device/Increase time);Without upper extremity assist;To chair/3-in-1  Ambulation/Gait  Ambulation/Gait Yes  Ambulation/Gait Assistance 6: Modified independent (Device/Increase time)  Ambulation/Gait Assistance Details around clinic with session  Assistive device None  Gait Pattern Step-through pattern  Ambulation Surface Level;Indoor  Stairs Yes  Stairs Assistance 6: Modified independent (Device/Increase time)  Stair Management Technique No rails;Alternating pattern;Forwards  Number of Stairs 4 (x4 reps)  Height of Stairs 6  Neuro Re-ed   Neuro Re-ed Details  for balance/NMR: gait around track with busy ball for 1 lap each- tracking ball with eyes and head with the following directions- left<>right, up<>down, circles both ways, "X" and "T". pt with most symptoms with circles toward right and when going up to the right with "X". Symptoms resolved quickly each time. Min guard assist  for safety.  Knee/Hip Exercises: Aerobic  Elliptical level 1.0 for 2 minutes each forward/backward with bil UE support. no symptoms or issues reported or noted. Pt cues to find stationary spot to focus, not on the the timer or numbers that move, to cover them as needed. Pt has elliptical at home and after no issues using one in clinic she plans to try hers at home.      PT Short Term Goals - 05/15/21 1546      PT SHORT TERM GOAL #1   Title Pt will be independent with HEP for improved strength, balance, transfers, and gait.  TARGET 05/09/2021    Baseline 05/15/21: met with current program, including walking outside.    Status Achieved      PT SHORT TERM GOAL #2   Title Pt will improve FGA score to at least 26/30 to decrease fall risk.    Baseline 23/30    Time 4    Period Weeks    Status New      PT SHORT TERM GOAL #3   Title Pt will ambulate at least 10 minutes on level, unlevel surfaces with no increase in reported unsteadiness/vestibular symptoms, for improved activity tolerance for return to leisure, work activities.    Baseline 05/15/21: has met the time component at home, however continues to report mild sway with looking left    Status Partially Met      PT SHORT TERM GOAL #4   Title Pt will negotiate at least 12 steps, alternating pattern, no handrail, independnetly for improved stair negotiation.    Time 4    Period Weeks    Status New             PT Long Term Goals - 04/16/21 1857      PT LONG TERM GOAL #1   Title Pt will be independent with HEP for improved strength, balance, transfers, and gait.  TARGET 06/06/2021    Time 8    Period Weeks    Status New      PT LONG TERM GOAL #2   Title Pt will improve FGA score to at least 28/30 to decrease fall risk.    Time 8    Period Weeks    Status New      PT LONG TERM GOAL #3   Title Pt will perform at least 20 minutes of continuous exercise/activity/gait tasks with no reported increase in vestibular symptoms.     Time 8    Period Weeks    Status New      PT LONG TERM GOAL #4   Title FOTO score for lower extremity to improve to at least 77 for improve overall return to PLOF.    Time 8    Period Weeks    Status New              05/15/21 1545  Plan  Clinical Impression Statement Today's skilled session focused on progress toward STGs with goals partially to fullly met. Pt also met her LTG for Functional Gait Index with score of 30/30 today. Remainder of  session continued to focus on balance/habituation activities with no significant issues noted or reported. The pt is progressing and should benefit from continued PT to progress toward unmet goals.  Personal Factors and Comorbidities Comorbidity 1  Comorbidities hx of knee surgery (L)  Examination-Activity Limitations Locomotion Level;Transfers;Squat;Carry  Examination-Participation Restrictions Community Activity;Driving  Pt will benefit from skilled therapeutic intervention in order to improve on the following deficits Abnormal gait;Decreased mobility;Decreased strength;Dizziness;Difficulty walking;Decreased activity tolerance  Stability/Clinical Decision Making Stable/Uncomplicated  Rehab Potential Good  PT Frequency Other (comment) (2x/wk for 4 weeks, then 1x/wk for 4 weeks, after eval)  PT Duration Other (comment) (total POC = 8 weeks)  PT Treatment/Interventions ADLs/Self Care Home Management;Gait training;Stair training;Functional mobility training;Therapeutic activities;Therapeutic exercise;Balance training;Neuromuscular re-education;Patient/family education;Vestibular  PT Next Visit Plan Review HEP updates and continue to work on balance- including high level balance, compliant surface, gentle head and body motions; habiutation ex's.  Progress to busier backgrounds with gaze stabilization.  Consulted and Agree with Plan of Care Patient         Patient will benefit from skilled therapeutic intervention in order to improve the  following deficits and impairments:  Abnormal gait,Decreased mobility,Decreased strength,Dizziness,Difficulty walking,Decreased activity tolerance  Visit Diagnosis: Other abnormalities of gait and mobility  Unsteadiness on feet  Dizziness and giddiness     Problem List Patient Active Problem List   Diagnosis Date Noted  . Cerebellar infarct [facility]) 04/09/2021    Willow Ora, PTA, [facility] [address] [place], [place] [address] [phone] 05/20/21, 2:55 PM   Name: Caroline Graham MRN: [id number] Date of Birth: November 13, 1991

## 2021-05-21 ENCOUNTER — Other Ambulatory Visit: Payer: Self-pay

## 2021-05-21 ENCOUNTER — Ambulatory Visit: Payer: 59 | Admitting: Occupational Therapy

## 2021-05-21 ENCOUNTER — Encounter: Payer: Self-pay | Admitting: Physical Therapy

## 2021-05-21 ENCOUNTER — Ambulatory Visit: Payer: 59 | Attending: Internal Medicine | Admitting: Physical Therapy

## 2021-05-21 DIAGNOSIS — R2681 Unsteadiness on feet: Secondary | ICD-10-CM | POA: Diagnosis not present

## 2021-05-21 DIAGNOSIS — R278 Other lack of coordination: Secondary | ICD-10-CM | POA: Insufficient documentation

## 2021-05-21 DIAGNOSIS — R2689 Other abnormalities of gait and mobility: Secondary | ICD-10-CM | POA: Insufficient documentation

## 2021-05-21 DIAGNOSIS — R42 Dizziness and giddiness: Secondary | ICD-10-CM | POA: Insufficient documentation

## 2021-05-21 DIAGNOSIS — R41841 Cognitive communication deficit: Secondary | ICD-10-CM | POA: Insufficient documentation

## 2021-05-21 NOTE — Therapy (Signed)
[facility] [address] [place], [place], [address] Phone: [phone]   Fax:  [phone]  Physical Therapy Treatment  Patient Details  Name: Caroline Graham MRN: [id number] Date of Birth: [birth year] Referring Provider (PT): Frann Rider, NP)   Encounter Date: 05/21/2021   PT End of Session - 05/21/21 1819    Visit Number 9    Number of Visits 13    Authorization Type UHC-90 combined visits PT, OT, speech    PT Start Time 1618    PT Stop Time 1700    PT Time Calculation (min) 42 min    Activity Tolerance Patient tolerated treatment well   slight increase in symptoms   Behavior During Therapy [facility] for tasks assessed/performed           History reviewed. No pertinent past medical history.  Past Surgical History:  Procedure Laterality Date  . KNEE SURGERY    . TONSILLECTOMY      There were no vitals filed for this visit.   Subjective Assessment - 05/21/21 1621    Subjective Been working on the elliptical at home, up to about 5 minutes.  Been out again to stores and I tried to drive.  Trying to concentrate really tired me out, but I wasn't dizzy.  The increase in Topomax has helped with the headaches.    Pertinent History Left vertebral Artery Dissection    Patient Stated Goals Pt's goals for PT are to regain coordination and balance; build more confidence.    Currently in Pain? No/denies    Pain Onset 1 to 4 weeks ago                             [facility] Adult PT Treatment/Exercise - 05/21/21 0001      Self-Care   Self-Care Other Self-Care Comments    Other Self-Care Comments  Discussed pt's overall progress with therapy:  pt feels balance is better and she feels going to a store isn't as bad as it was in the beginning.  However, she does report initial attempt to drive and overall feeling of fatigue with return to such tasks.  Discussed fatigue with increased mental energy associated  with higher level vestibular and coordination tasks following a CVA.  Discussed energy conservation, in the way of avoiding TOO much activity that will cause her to be so fatigued she would feel it the next day; trying to conserve energy so that she is more level with energy the next day and to continue to allow herself plenty of rest.      Neuro Re-ed    Neuro Re-ed Details  for balance/NMR: gait around track passing busy ball horizontal, then up/down to therapist, taking from therapist, with minimal c/o dizziness (looking up and L); performed at least 4 laps around track.  Standing on solid surface, then on blue mat with busy ball tracking ball with eyes and head with the following directions- V motion, circles both ways.  Then figure-8 through legs and return to stand tall, 2 reps x 2 sets, then addition of body turn, performed x 2 reps each direction.  Minimal symptoms (2/10 with figure-8 and turn)Symptoms resolved quickly each time. Min guard assist for safety.  Standing on incline and decline of ramp:  marching in place>added head turns, then head steady with arm swing motions, then arm swing to tap opposite knee, min guard, no increase in symtpoms.  Vestibular Treatment/Exercise - 05/21/21 0001      Vestibular Treatment/Exercise   Vestibular Treatment Provided Gaze    Habituation Exercises Standing Horizontal Head Turns;Standing Vertical Head Turns    Gaze Exercises X1 Viewing Horizontal;X1 Viewing Vertical      Standing Horizontal Head Turns   Number of Reps  5   standing on Airex, looking to targets (horizontal)   Symptom Description  no increase in symptoms; progressed to V-targets x 10 reps, no increase in symptoms      X1 Viewing Horizontal   Foot Position Standing feet wide:  solid surface, then on Airex    Reps 10   each   Comments Busy background (target on Posture Zone poster)      X1 Viewing Vertical   Foot Position standing feet apart:  on solid surface, then on  Airex    Reps 10   each   Comments Busy background, target on posture zone poster.           Neuro Re-education (Continued): Standing on Airex, trunk rotation in corner, reaching across body to targets, x 10 reps Head turns with gait in hallway, 20-30 ft, 4 reps, minimal sway with gait  Elliptical x 3 minutes, with gentle head motions (vertical) looking at target forward, then down at speed.  Pt c/o occasional symptoms, but "not too bad"       PT Education - 05/21/21 1819    Education Details See self-care    Person(s) Educated Patient    Methods Explanation    Comprehension Verbalized understanding            PT Short Term Goals - 05/15/21 1546      PT SHORT TERM GOAL #1   Title Pt will be independent with HEP for improved strength, balance, transfers, and gait.  TARGET 05/09/2021    Baseline 05/15/21: met with current program, including walking outside.    Status Achieved      PT SHORT TERM GOAL #2   Title Pt will improve FGA score to at least 26/30 to decrease fall risk.    Baseline 05/15/21: 30/30 scored today    Time --    Period --    Status Achieved      PT SHORT TERM GOAL #3   Title Pt will ambulate at least 10 minutes on level, unlevel surfaces with no increase in reported unsteadiness/vestibular symptoms, for improved activity tolerance for return to leisure, work activities.    Baseline 05/15/21: has met the time component at home, however continues to report mild sway with looking left    Status Partially Met      PT SHORT TERM GOAL #4   Title Pt will negotiate at least 12 steps, alternating pattern, no handrail, independnetly for improved stair negotiation.    Baseline 05/15/21: met in session today    Time --    Period --    Status Achieved             PT Long Term Goals - 05/20/21 1507      PT LONG TERM GOAL #1   Title Pt will be independent with HEP for improved strength, balance, transfers, and gait.  TARGET 06/06/2021    Time 8    Period  Weeks    Status On-going      PT LONG TERM GOAL #2   Title Pt will improve FGA score to at least 28/30 to decrease fall risk.    Baseline 05/15/21: 30/30 scored today  Status Achieved      PT LONG TERM GOAL #3   Title Pt will perform at least 20 minutes of continuous exercise/activity/gait tasks with no reported increase in vestibular symptoms.    Time 8    Period Weeks    Status On-going      PT LONG TERM GOAL #4   Title FOTO score for lower extremity to improve to at least 77 for improve overall return to PLOF.    Time 8    Period Weeks    Status On-going                 Plan - 05/21/21 1820    Clinical Impression Statement Continued to work on dynamic balance, head and body motions, also trying to incorporate general increase in body movement (marching, elliptical) while adding head motion and arm swing, simulating busier environments.  She has 2/10 reports of symptoms with looking up and to the L with some rotational movements; overall symptoms in therapy resolve quickly.  She is progressing towards goals, and she will continue benefit from skilled PT to address vestibular and dynamic balance issues.    Personal Factors and Comorbidities Comorbidity 1    Comorbidities hx of knee surgery (L)    Examination-Activity Limitations Locomotion Level;Transfers;Squat;Carry    Examination-Participation Restrictions Community Activity;Driving    Stability/Clinical Decision Making Stable/Uncomplicated    Rehab Potential Good    PT Frequency Other (comment)   2x/wk for 4 weeks, then 1x/wk for 4 weeks, after eval   PT Duration Other (comment)   total POC = 8 weeks   PT Treatment/Interventions ADLs/Self Care Home Management;Gait training;Stair training;Functional mobility training;Therapeutic activities;Therapeutic exercise;Balance training;Neuromuscular re-education;Patient/family education;Vestibular    PT Next Visit Plan Consider adding to HEP (?marching in place with head motions).   Try activities in session that would simulate busier surroundings (arm swing motions with head turns, use Boomwhackers, try zoom ball); consider treadmill/elliptical and incorporate head motions in session.    Consulted and Agree with Plan of Care Patient           Patient will benefit from skilled therapeutic intervention in order to improve the following deficits and impairments:  Abnormal gait,Decreased mobility,Decreased strength,Dizziness,Difficulty walking,Decreased activity tolerance  Visit Diagnosis: Unsteadiness on feet  Dizziness and giddiness     Problem List Patient Active Problem List   Diagnosis Date Noted  . Cerebellar infarct ([facility]) 04/09/2021    Nianna Igo W. 05/21/2021, 6:24 PM  Frazier Butt., PT   [facility] [address] [place], [place], [address] Phone: [phone]   Fax:  [phone]  Name: Caroline Graham MRN: [id number] Date of Birth: 1991/01/13

## 2021-05-23 ENCOUNTER — Ambulatory Visit: Payer: 59 | Admitting: Physical Therapy

## 2021-05-23 ENCOUNTER — Encounter: Payer: Self-pay | Admitting: Physical Therapy

## 2021-05-23 DIAGNOSIS — R278 Other lack of coordination: Secondary | ICD-10-CM

## 2021-05-23 DIAGNOSIS — R2681 Unsteadiness on feet: Secondary | ICD-10-CM

## 2021-05-23 DIAGNOSIS — R42 Dizziness and giddiness: Secondary | ICD-10-CM

## 2021-05-23 NOTE — Therapy (Signed)
[name] [address] [place], [place], [address] Phone: [phone]   Fax:  [phone]  Physical Therapy Treatment  Patient Details  Name: Caroline Graham MRN: [id number] Date of Birth: February 19, 1991 Referring Provider (PT): Frann Rider, NP)   Encounter Date: 05/23/2021   PT End of Session - 05/23/21 1556    Visit Number 10    Number of Visits 13    Authorization Type UHC-90 combined visits PT, OT, speech    PT Start Time 1448    PT Stop Time 1530    PT Time Calculation (min) 42 min    Activity Tolerance Patient tolerated treatment well   slight increase in symptoms   Behavior During Therapy [facility] for tasks assessed/performed           History reviewed. No pertinent past medical history.  Past Surgical History:  Procedure Laterality Date  . KNEE SURGERY    . TONSILLECTOMY      There were no vitals filed for this visit.   Subjective Assessment - 05/23/21 1452    Subjective No new complaints. No falls to report. Has not tried driving again, wants to wait till she see's Neurologist again (in August). Has questions about starting to ride a bike as progression to back to motocycle.    Pertinent History Left vertebral Artery Dissection                             [facility] Adult PT Treatment/Exercise - 05/23/21 1502      Transfers   Transfers Sit to Stand;Stand to Sit    Sit to Stand 6: Modified independent (Device/Increase time);Without upper extremity assist;From chair/3-in-1    Stand to Sit 6: Modified independent (Device/Increase time);Without upper extremity assist;To chair/3-in-1      Ambulation/Gait   Ambulation/Gait Yes    Ambulation/Gait Assistance 6: Modified independent (Device/Increase time)    Assistive device None    Gait Pattern Step-through pattern    Ambulation Surface Level;Indoor      Self-Care   Self-Care Other Self-Care Comments    Other Self-Care Comments   discussed pt's questions on process for return to riding sports motocycle. starting with bike. has only motocycle helmet      Neuro Re-ed    Neuro Re-ed Details  for balance/NMR: gait around track with busy 'Batman" ball-  vertical, laterally, then "T", "x" with most symtoms with "x"     with boom wackers while walking around track-      with large clear sun ball- on floor:    progressing to on blue mat for 3 reps. mild symptoms that resolved quickly      Knee/Hip Exercises: Aerobic   Tread Mill x 5 minutes at level 3.0 scanning left<>right<>left every ~50 seconds to simulate looking to cross a street. bil UE support on treadmill.                    PT Short Term Goals - 05/15/21 1546      PT SHORT TERM GOAL #1   Title Pt will be independent with HEP for improved strength, balance, transfers, and gait.  TARGET 05/09/2021    Baseline 05/15/21: met with current program, including walking outside.    Status Achieved      PT SHORT TERM GOAL #2   Title Pt will improve FGA score to at least 26/30 to decrease fall risk.    Baseline 05/15/21:  30/30 scored today    Time --    Period --    Status Achieved      PT SHORT TERM GOAL #3   Title Pt will ambulate at least 10 minutes on level, unlevel surfaces with no increase in reported unsteadiness/vestibular symptoms, for improved activity tolerance for return to leisure, work activities.    Baseline 05/15/21: has met the time component at home, however continues to report mild sway with looking left    Status Partially Met      PT SHORT TERM GOAL #4   Title Pt will negotiate at least 12 steps, alternating pattern, no handrail, independnetly for improved stair negotiation.    Baseline 05/15/21: met in session today    Time --    Period --    Status Achieved             PT Long Term Goals - 05/20/21 1507      PT LONG TERM GOAL #1   Title Pt will be independent with HEP for improved strength, balance, transfers, and gait.  TARGET  06/06/2021    Time 8    Period Weeks    Status On-going      PT LONG TERM GOAL #2   Title Pt will improve FGA score to at least 28/30 to decrease fall risk.    Baseline 05/15/21: 30/30 scored today    Status Achieved      PT LONG TERM GOAL #3   Title Pt will perform at least 20 minutes of continuous exercise/activity/gait tasks with no reported increase in vestibular symptoms.    Time 8    Period Weeks    Status On-going      PT LONG TERM GOAL #4   Title FOTO score for lower extremity to improve to at least 77 for improve overall return to PLOF.    Time 8    Period Weeks    Status On-going                 Plan - 05/23/21 1555    Personal Factors and Comorbidities Comorbidity 1    Comorbidities hx of knee surgery (L)    Examination-Activity Limitations Locomotion Level;Transfers;Squat;Carry    Examination-Participation Restrictions Community Activity;Driving    Stability/Clinical Decision Making Stable/Uncomplicated    Rehab Potential Good    PT Frequency Other (comment)   2x/wk for 4 weeks, then 1x/wk for 4 weeks, after eval   PT Duration Other (comment)   total POC = 8 weeks   PT Treatment/Interventions ADLs/Self Care Home Management;Gait training;Stair training;Functional mobility training;Therapeutic activities;Therapeutic exercise;Balance training;Neuromuscular re-education;Patient/family education;Vestibular    PT Next Visit Plan Consider adding to HEP (?marching in place with head motions).  Try activities in session that would simulate busier surroundings (arm swing motions with head turns, use Boomwhackers, try zoom ball); consider treadmill/elliptical and incorporate head motions in session.    Consulted and Agree with Plan of Care Patient           Patient will benefit from skilled therapeutic intervention in order to improve the following deficits and impairments:  Abnormal gait,Decreased mobility,Decreased strength,Dizziness,Difficulty walking,Decreased  activity tolerance  Visit Diagnosis: Unsteadiness on feet  Dizziness and giddiness  Other lack of coordination     Problem List Patient Active Problem List   Diagnosis Date Noted  . Cerebellar infarct [facility]) 04/09/2021   Willow Ora, PTA, [facility] [address], [place], [place] [address] [phone] 05/23/21, 3:56 PM  Name: Caroline Graham MRN: [id number] Date of Birth: 08-19-1991

## 2021-05-28 ENCOUNTER — Ambulatory Visit: Payer: 59 | Admitting: Physical Therapy

## 2021-05-28 ENCOUNTER — Encounter: Payer: 59 | Admitting: Occupational Therapy

## 2021-06-04 ENCOUNTER — Ambulatory Visit: Payer: 59 | Admitting: Physical Therapy

## 2021-06-04 ENCOUNTER — Other Ambulatory Visit: Payer: Self-pay

## 2021-06-04 ENCOUNTER — Encounter: Payer: Self-pay | Admitting: Physical Therapy

## 2021-06-04 DIAGNOSIS — R2689 Other abnormalities of gait and mobility: Secondary | ICD-10-CM

## 2021-06-04 DIAGNOSIS — R42 Dizziness and giddiness: Secondary | ICD-10-CM

## 2021-06-04 DIAGNOSIS — R2681 Unsteadiness on feet: Secondary | ICD-10-CM | POA: Diagnosis not present

## 2021-06-05 NOTE — Patient Instructions (Signed)
Marching in place with head turns (try to perform 1-2 sets of 10, 1-2x/day)-progressed to busy background area  Increase walking speed/head turn speed in hallway with head turns, 2-3x/day  Increase time on elliptical machine, up to 10 minutes or 2-5 minutes bouts per day

## 2021-06-05 NOTE — Therapy (Signed)
[facility] [address] [place], [place], [address] Phone: [phone]   Fax:  [phone]  Physical Therapy Treatment/Recert  Patient Details  Name: Caroline Graham MRN: [id number] Date of Birth: May 07, 1991 Referring Provider (PT): Frann Rider, NP)   Encounter Date: 06/04/2021   PT End of Session - 06/04/21 1630     Visit Number 11    Number of Visits 15    Date for PT Re-Evaluation 07/04/21    Authorization Type [employer]90 combined visits PT, OT, speech    PT Start Time 1620    PT Stop Time 1707    PT Time Calculation (min) 47 min    Activity Tolerance Patient tolerated treatment well   slight increase in symptoms with SOT   Behavior During Therapy [facility] for tasks assessed/performed             History reviewed. No pertinent past medical history.  Past Surgical History:  Procedure Laterality Date   KNEE SURGERY     TONSILLECTOMY      There were no vitals filed for this visit.   Subjective Assessment - 06/04/21 1623     Subjective Still trying to learn my limits; the symptoms just continue to vary day by day.  Sometimes it just feels like my brain is oscillating in my head.  Other times it isn't that bad.  REally beginning to get concerned about my concentration level and getting back to school in the fall.    Pertinent History Left vertebral Artery Dissection    Patient Stated Goals Pt's goals for PT are to regain coordination and balance; build more confidence.    Currently in Pain? No/denies              Sensory ORganization Test (SOT) Results:  Condition1:  WNL all 3 trials Condition 2:  WNL all 3 trials Condition 3:  WNL trial 1 and 3; decreased trial 2 Condition 4:  WNL trial 1 and 2, decreased trial 3 Condition 5:  decreased trial 1, WNL trial 2 and 3 Condition 6:  Decreased trial 1 and 3, WNL trial 2  Composite score:  67 (decreased); normal - 70  Sensory  Analysis: Somatosensory, Visual, Vestibular systems WNL Preference for sensory system use for balance:  decreased  Center of Gravity:  Posterior and to L     Performed the following to add to HEP  Marching in place with head turns (try to perform 1-2 sets of 10, 1-2x/day)-progressed to busy background area  Verbally instructed pt to perform the following as part of HEP:  Increase walking speed/head turn speed in hallway with head turns, 2-3x/day  Increase time on elliptical machine, up to 10 minutes or 2-5 minutes bouts per day                  Self Care:  Reviewed discussion about avoiding excess activity/overdoing it in busy environments; reiterated short bouts of busy environment activities, building in plenty of rest breaks into daily activities. Discussed pt's concerns regarding return to school and discussed possibility of speech therapy to address pt's concerns of higher level "logic" difficulties as well as neuropsychology for in-depth cognitive testing or counseling in relation to deficits from pt's injury. Discussed results of Sensory ORganization test, PT POC Pt verbalizes understanding.   PT Education - 06/05/21 1449     Education Details Updates to HEP              PT Short Term  Goals - 06/05/21 1502       PT SHORT TERM GOAL #1   Title STGs = LTGs (0/51/1021 recert)    Baseline --    Status --      PT SHORT TERM GOAL #2   Title --    Baseline --    Status --      PT SHORT TERM GOAL #3   Title --    Baseline --    Status --      PT SHORT TERM GOAL #4   Title --    Baseline --    Status --               PT Long Term Goals - 06/05/21 1453       PT LONG TERM GOAL #1   Title Pt will be independent with HEP for improved strength, balance, transfers, and gait.  TARGET 06/06/2021    Baseline independent with HEP as progressing through therapy; updates provided 06/04/2021    Time 8    Period Weeks    Status Partially Met       PT LONG TERM GOAL #2   Title Pt will improve FGA score to at least 28/30 to decrease fall risk.    Baseline 05/15/21: 30/30 scored today    Status Achieved      PT LONG TERM GOAL #3   Title Pt will perform at least 20 minutes of continuous exercise/activity/gait tasks with no reported increase in vestibular symptoms.    Baseline varies day to day    Time 8    Period Weeks    Status Partially Met      PT LONG TERM GOAL #4   Title FOTO score for lower extremity to improve to at least 77 for improve overall return to PLOF.    Baseline 92 on FOTO 06/04/2021    Time 8    Period Weeks    Status Achieved             New goals for recert:    PT Long Term Goals - 06/05/21 1513       PT LONG TERM GOAL #1   Title Pt will be independent with progression of HEP for improved strength, balance, transfers, and gait.  TARGET 07/03/2021    Baseline independent with HEP as progressing through therapy; updates provided 06/04/2021    Time 4    Period Weeks    Status On-going      PT LONG TERM GOAL #2   Title Pt will improve Sensory Organization test composite score to WNL, for improved overall balance, vestibular system/preference use for balance.    Baseline Composite score 67    Time 4    Status New      PT LONG TERM GOAL #3   Title Pt will perform at least 20 minutes of continuous exercise/activity/gait tasks with no reported increase in vestibular symptoms.    Baseline varies day to day    Time 4    Period Weeks    Status On-going      PT LONG TERM GOAL #4   Title FOTO score for lower extremity to improve to at least 77 for improve overall return to PLOF.    Baseline 92 on FOTO 06/04/2021    Time 8    Period Weeks    Status Achieved                  Plan - 06/05/21 1450  Clinical Impression Statement Pt has not been seen since 05/23/2021, due to therapist being out of clinic unexpectedly.  Assessed LTGs this visit.  Pt overall reports improvement in symptoms, but she  does note some days/situations (increased fatigue, busy surroundings such as stores), where she becomes very dizzy/unsteady and fatigued.  Pt has met 2 of 4 LTGs; she has met LTG 2 for FGA and LTG 4 for improved FOTO score.  Sensory Organization test (SOT) assessed today, with pt demo decreased composite balance score of 67 and decreased preference score on sensory analysis.  Pt is making slow, steady progress with PT, but would benefit from continued skilled PT to further address high level vestibular and balance activities for progress back to work and community activities.    Personal Factors and Comorbidities Comorbidity 1    Comorbidities hx of knee surgery (L)    Examination-Activity Limitations Locomotion Level;Transfers;Squat;Carry    Examination-Participation Restrictions Community Activity;Driving    Stability/Clinical Decision Making Stable/Uncomplicated    Rehab Potential Good    PT Frequency 1x / week    PT Duration 4 weeks   per recert 9/44/9675   PT Treatment/Interventions ADLs/Self Care Home Management;Gait training;Stair training;Functional mobility training;Therapeutic activities;Therapeutic exercise;Balance training;Neuromuscular re-education;Patient/family education;Vestibular    PT Next Visit Plan How did marching/head turns go added to HEP?  Check about increased time on elliptical;  Try activities in session that would simulate busier surroundings (arm swing motions with head turns, use Boomwhackers, try zoom ball or hula hoops); consider treadmill/seated stepper and incorporate head motions in session.    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 06/05/2021, 3:06 PM Frazier Butt., PT  [facility] [address] [place], [place], [address] Phone: [phone]   Fax:  [phone]  Name: ZIOMARA BIRENBAUM MRN: [id number] Date of Birth: Feb 01, 1991

## 2021-06-06 ENCOUNTER — Telehealth: Payer: Self-pay | Admitting: Physical Therapy

## 2021-06-06 DIAGNOSIS — I69319 Unspecified symptoms and signs involving cognitive functions following cerebral infarction: Secondary | ICD-10-CM

## 2021-06-06 NOTE — Telephone Encounter (Signed)
[place] has been working with physical therapy for her vestibular, dizziness episodes, and continues to make slow and steady progress.  She has voiced some concerns recently about return to school and higher level cognitive/reasoning skills needed for this as well as stressors about return to school/work that are a concern given her diagnosis and ongoing deficits.    I suggested the possibility of speech therapy for the high level cognitive/reasoning concerns and I suggested the possibility of neuropsychology to address ongoing stressors in the presence of her diagnosis.  She was open to these suggestions.  If you agree, could you please write order for speech therapy eval and/or neuropsychology eval?  Thank you.  Lonia Blood, PT 06/06/21 7:04 AM Phone: [phone] Fax: [phone]

## 2021-06-09 NOTE — Telephone Encounter (Signed)
Orders placed as requested.  Thank you for reaching out and please let me know if you need anything else!   Shanda Bumps, NP

## 2021-06-11 ENCOUNTER — Other Ambulatory Visit: Payer: Self-pay

## 2021-06-11 ENCOUNTER — Ambulatory Visit: Payer: 59 | Admitting: Physical Therapy

## 2021-06-11 ENCOUNTER — Ambulatory Visit: Payer: 59

## 2021-06-11 VITALS — BP 114/68 | HR 70

## 2021-06-11 DIAGNOSIS — R41841 Cognitive communication deficit: Secondary | ICD-10-CM

## 2021-06-11 DIAGNOSIS — R2689 Other abnormalities of gait and mobility: Secondary | ICD-10-CM

## 2021-06-11 DIAGNOSIS — Z0289 Encounter for other administrative examinations: Secondary | ICD-10-CM

## 2021-06-11 DIAGNOSIS — R42 Dizziness and giddiness: Secondary | ICD-10-CM

## 2021-06-11 DIAGNOSIS — R2681 Unsteadiness on feet: Secondary | ICD-10-CM | POA: Diagnosis not present

## 2021-06-11 NOTE — Patient Instructions (Addendum)
Access Code: [email or web address] URL: [email or web address] Date: 06/11/2021 Prepared by: Lonia Blood  Exercises Standing VOR Cancellation - 2-3 x daily - 7 x weekly - 1-2 sets - 10 reps  -head/eye movements moving with target  -head movements moving opposite target and eyes following target (Standing on pillow in corner at home)

## 2021-06-11 NOTE — Patient Instructions (Signed)
Constant Therapy (14 free days) -Using math and numbers, remembering, staying focused   Strategies for Improving Your Attention and Memory  Use good eye-contact Give the speaker your undivided attention Look directly at the speaker  Complete one task at a time Avoid multitasking Complete one task before starting a new one Write a note to yourself if you think of something else that needs to be done Let others know when you need quiet time and can't be interrupted Don't answer the phone, texts, or emails while you are working on another task  Put aside distracting thoughts If you find your mind wandering, refocus your attention on the speaker Avoid off-topic comments or responses that may divert your attention  If something important comes to mind, let the speaker know and pause to write yourself a note: "Do you mind holding on a minute, I have to write something down." Put thoughts on hold and focus on salient information  Limit distractions in your environment Think about the environment around you Limit background noise by turning off the TV or music, putting your phone away Close the door and work in quiet  Use active listening Actively participate in the conversation to stay focused Paraphrase what you have heard to include the most important details Adding some associations may help you remember Ask questions to clarify certain points Summarize the speaker's comments periodically Avoid nodding your head and using "mhm" responses as these are more passive and don't help your attention  Alert the other person/people It may be helpful to alert your listener to the fact that you may need reminders to keep on track Tell the speaker in advance that you may need to stop them and have them repeat salient information If you lose focus, interject and let the person know, "I'm sorry, I lost you, can you tell me again?"  Write down information Write down pertinent information as it  comes up, such as telephone numbers, names of people, addresses, details from appointments and conversations, etc.

## 2021-06-11 NOTE — Therapy (Signed)
[facility] [address] [place], [place], [address] Phone: [phone]   Fax:  [phone]  Physical Therapy Treatment  Patient Details  Name: [place] MRN: [id number] Date of Birth: [birth year] Referring Provider (PT): Ihor Austin, NP)   Encounter Date: 06/11/2021   PT End of Session - 06/11/21 1609     Visit Number 12    Number of Visits 15    Date for PT Re-Evaluation 07/04/21    Authorization Type UHC-90 combined visits PT, OT, speech    PT Start Time 1617    PT Stop Time 1700    PT Time Calculation (min) 43 min    Activity Tolerance Patient tolerated treatment well   slight increase in symptoms with standing on foam, visual tracking with head motion opposite of target motion   Behavior During Therapy [facility] for tasks assessed/performed             No past medical history on file.  Past Surgical History:  Procedure Laterality Date   KNEE SURGERY     TONSILLECTOMY      Vitals:   06/11/21 1613 06/11/21 1647  BP: 119/70 114/68  Pulse: 68 70     Subjective Assessment - 06/11/21 1613     Subjective Week has been okay.  Have been doing a little more, a little driving more myself.  The last 2 days, I've run out a short distance, and it really zaps me, and it brings on that headache that I had initially.  Went into a store today and that brought on the headache, and then I rested/took tylenol.    Pertinent History Left vertebral Artery Dissection    Patient Stated Goals Pt's goals for PT are to regain coordination and balance; build more confidence.    Currently in Pain? Yes    Pain Score 2    was a 6/10 earlier today.   Pain Location Head    Pain Orientation Upper    Pain Descriptors / Indicators Headache    Pain Type Acute pain    Pain Onset 1 to 4 weeks ago    Pain Frequency Constant    Aggravating Factors  visually straining, busy environment    Pain Relieving Factors rest, tylenol,  dark room                Neuro Re-education:   Pt rates 1/0 symptoms swishiness baseline  Seated gaze with lateral target movements, x 1 viewing, 2 sets x 5 reps;  then performed in standing 2 sets x 5 reps.  Then performed standing on foam, 5 reps.  No increase in symptoms.  Seated gaze with lateral target movements, x 2 viewing, 2 sets of 5 reps (Pt rates 2/20 symptoms);  then performed in standing, 2 sets x 5 reps.  No increase in symptoms.  Then progressed to standing on unlevel surface x 5 reps (sx reported at 1/10).  Standing on foam marching in place x 10 with head turns, then added reciprocal arm swing with marching and head turns x 10 reps, no increase in symptoms.  Standing on pillows, x 1 viewing with head/eye motions following target, side to side direction 5 reps no increase in symptoms.               [facility] Adult PT Treatment/Exercise - 06/11/21 0001       Therapeutic Activites    Therapeutic Activities Other Therapeutic Activities    Other Therapeutic Activities Gait on  treadmill at 2.5 mph with BUE support, x 5 minutes, head turns looking at cards on R and L, high and low, varied positions; no increase noted in symptoms.  Vitals taken pre and post treadmill, and discussed modified RPE scale, working around 6/10 with treadmill in clinic and educated pt to use this scale for moderate activity with the elliptical use at home.  Pt reports when symptoms come on with activity in store or with driving or when she feels her HR increase with elliptical, she will lie down to resolve symptoms.  Educated pt to sit to rest in a quiet area and use visual target until symptoms subside, versus always lying down to resolve.                    PT Education - 06/11/21 1848     Education Details Addition to [employer]) Educated Patient    Methods Explanation;Handout;Demonstration    Comprehension Verbalized understanding;Returned demonstration               PT Short Term Goals - 06/05/21 1502       PT SHORT TERM GOAL #1   Title STGs = LTGs (06/04/2021 recert)    Baseline --    Status --      PT SHORT TERM GOAL #2   Title --    Baseline --    Status --      PT SHORT TERM GOAL #3   Title --    Baseline --    Status --      PT SHORT TERM GOAL #4   Title --    Baseline --    Status --               PT Long Term Goals - 06/05/21 1513       PT LONG TERM GOAL #1   Title Pt will be independent with progression of HEP for improved strength, balance, transfers, and gait.  TARGET 07/03/2021    Baseline independent with HEP as progressing through therapy; updates provided 06/04/2021    Time 4    Period Weeks    Status On-going      PT LONG TERM GOAL #2   Title Pt will improve Sensory Organization test composite score to WNL, for improved overall balance, vestibular system/preference use for balance.    Baseline Composite score 67    Time 4    Status New      PT LONG TERM GOAL #3   Title Pt will perform at least 20 minutes of continuous exercise/activity/gait tasks with no reported increase in vestibular symptoms.    Baseline varies day to day    Time 4    Period Weeks    Status On-going      PT LONG TERM GOAL #4   Title FOTO score for lower extremity to improve to at least 77 for improve overall return to PLOF.    Baseline 92 on FOTO 06/04/2021    Time 8    Period Weeks    Status Achieved                   Plan - 06/11/21 1849     Clinical Impression Statement Pt reports doing slightly more this week, including shopping at several stores as well as short distance driving.  She reports fatigue and "swishiness in head at times" and headache with these activities, which is resolved by lying down to rest.  In session today, symptoms of "swishiness in head" was brought on by seated and standing on compliant surface with x2 viewing activities.  Incorporated these into updating HEP.  Pt able to tolerate 5 minutes  of near continuous lateral head motions looking at cards with no increase in symptoms.  Educated pt to try to rest in sitting versus always lying down to resolve, to help with further habituation of synmptoms.  She will continue to benefit from skilled PT to address pt's dizziness and vestibular symtpoms to help her get back to independent PLOF.    Personal Factors and Comorbidities Comorbidity 1    Comorbidities hx of knee surgery (L)    Examination-Activity Limitations Locomotion Level;Transfers;Squat;Carry    Examination-Participation Restrictions Community Activity;Driving    Stability/Clinical Decision Making Stable/Uncomplicated    Rehab Potential Good    PT Frequency 1x / week    PT Duration 4 weeks   per recert 06/04/2021   PT Treatment/Interventions ADLs/Self Care Home Management;Gait training;Stair training;Functional mobility training;Therapeutic activities;Therapeutic exercise;Balance training;Neuromuscular re-education;Patient/family education;Vestibular    PT Next Visit Plan How update to HEP go?  Try activities in session that would simulate busier surroundings (arm swing motions with head turns, use Boomwhackers, try zoom ball or hula hoops.  Gait with x 1 and x2 viewing.  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 06/11/2021, 6:55 PM Lonia Blood, PT 06/11/21 6:58 PM Phone: [phone] Fax:  [phone]  [facility] [address] [place], [place], [address] Phone: [phone]   Fax:  [phone]  Name: [place] Graham MRN: [id number] Date of Birth: 01-12-1991

## 2021-06-12 NOTE — Therapy (Signed)
[facility] [address] [place], [place], [address] Phone: [phone]   Fax:  [phone]  Speech Language Pathology Evaluation  Patient Details  Name: Caroline Graham MRN: [id number] Date of Birth: [birth year] Referring Provider (SLP): Ihor Austin, NP   Encounter Date: 06/11/2021   End of Session - 06/11/21 1816     Visit Number 1    Number of Visits 9    Date for SLP Re-Evaluation 08/13/21    Authorization Time Period 90 PT/ST/OT    SLP Start Time 1700    SLP Stop Time  1745    SLP Time Calculation (min) 45 min    Activity Tolerance Patient tolerated treatment well             No past medical history on file.  Past Surgical History:  Procedure Laterality Date   KNEE SURGERY     TONSILLECTOMY      There were no vitals filed for this visit.       SLP Evaluation [facility] - 06/11/21 1705       SLP Visit Information   SLP Received On 06/09/21    Referring Provider (SLP) Ihor Austin, NP    Onset Date 04-08-21    Medical Diagnosis Cognitive deficit      Subjective   Patient/Family Stated Goal to get back to baseline      Pain Assessment   Currently in Pain? 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     Cognition   Overall Cognitive Status Impaired/Different from baseline    Area of Impairment Attention;Memory;Problem solving    Current Attention Level Divided    Memory Decreased short-term memory    Memory Comments pt endorsed difficulty with recall if she has taken medication, fed dogs, if she has already stated information, and what day it is     Problem Solving Slow processing;Difficulty sequencing    Problem Solving Comments prolonged processing and reduced problem solving to correct errors      Auditory Comprehension   Overall Auditory Comprehension Appears within functional limits for tasks assessed      Verbal Expression   Overall Verbal Expression Appears within functional limits for tasks assessed      Oral Motor/Sensory Function   Overall Oral Motor/Sensory Function Appears within functional limits for tasks assessed      Motor Speech   Overall Motor Speech Appears within functional limits for tasks assessed      Standardized Assessments   Standardized Assessments  Cognitive Linguistic Quick Test      Cognitive Linguistic Quick Test (Ages 18-69)   Attention WNL    Memory WNL    Executive Function WNL    Language WNL    Visuospatial Skills WNL    Severity Rating Total 20    Composite Severity Rating 16.8                             SLP Education - 06/11/21 1732     Education Details eval results,  possible goals, attention and memory compensations    Person(s) Educated Patient    Methods Explanation;Demonstration;Handout    Comprehension Verbalized understanding;Returned demonstration;Need further instruction              SLP Short Term Goals - 06/11/21 2025       SLP SHORT TERM GOAL #1   Title Pt will utilize 3 memory and attention compensations to reduce number of incomplete or forgotten tasks <5 per week with occasional min A over 2 sessions    Time 4    Period Weeks    Status New      SLP SHORT TERM GOAL #2   Title Pt will demonstrate problem solving for mod complex therapeutic tasks with occasional min A over 2 sessions    Time 4    Period Weeks    Status New      SLP SHORT TERM GOAL #3   Title Pt will verbally generate and carryover solutions to problems encountered in everyday living and work environment with occasional min A over 2 sessions    Time 4    Period Weeks     Status New              SLP Long Term Goals - 06/12/21 1000       SLP LONG TERM GOAL #1   Title Pt will utilize 3 memory and attention compensations to reduce number of incomplete or forgotten tasks <2 per week with rare min A over 2 sessions    Time 8    Period Weeks    Status New      SLP LONG TERM GOAL #2   Title Pt will demonstrate problem solving for mod complex therapeutic tasks with rare min A over 2 sessions    Time 8    Period Weeks    Status New      SLP LONG TERM GOAL #3   Title Pt will verbally generate and carryover solutions to problems encountered in everyday living and work environment with rare min A over 2 sessions    Time 8    Period Weeks    Status New              Plan - 06/11/21 1816     Clinical Impression Statement [name] presents for OPST evaluation secondary to change in cognition s/p CVA in April 2022. 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 Pt was in process of Bachelor's degree in Business adminstration and a minor in management at time of stroke. Given noticable slower processing, short term memory deficits, and reduced attention, pt expressed concern for returning to work and school given current level of functioning. Pt provided examples of forgetting to take medication, quesitoning if she fed her dog or not, and repeating information during conversations as pt forgot she said it. CLQT completed this session, in which all subtests were WNL; however, mild memory, attention, and executive functioning deficits exhibited. Pt verbalized awareness of how memory deficits impacted her overall performance, particularly story retell and generative naming. Increased processing time and reduced problem solving exhibited during design generation and mazes. Given high level cognitive deficits demonstrated and reported, skilled ST is  warranted to address cognitive linguistic skills impacting pt's ability to successfully return to work and school.    Speech Therapy Frequency 1x /week    Duration 8 weeks  Treatment/Interventions Compensatory strategies;Functional tasks;Cueing hierarchy;Compensatory techniques;Internal/external aids;SLP instruction and feedback;Cognitive reorganization;Multimodal communcation approach    Potential to Achieve Goals Good    SLP Home Exercise Plan provided    Consulted and Agree with Plan of Care Patient             Patient will benefit from skilled therapeutic intervention in order to improve the following deficits and impairments:   Cognitive communication deficit    Problem List Patient Active Problem List   Diagnosis Date Noted   Cerebellar infarct (HCC) 04/09/2021    Janann Colonel, MA CCC-SLP 06/12/2021, 10:10 AM  [facility] [address] [place], [place], [address] Phone: [phone]   Fax:  [phone]  Name: Caroline Graham MRN: [id number] Date of Birth: Oct 30, 1991

## 2021-06-16 ENCOUNTER — Other Ambulatory Visit: Payer: Self-pay | Admitting: Adult Health

## 2021-06-16 DIAGNOSIS — I725 Aneurysm of other precerebral arteries: Secondary | ICD-10-CM

## 2021-06-16 DIAGNOSIS — I639 Cerebral infarction, unspecified: Secondary | ICD-10-CM

## 2021-06-16 DIAGNOSIS — I7774 Dissection of vertebral artery: Secondary | ICD-10-CM

## 2021-06-16 NOTE — Progress Notes (Signed)
Opened chart in error.

## 2021-06-17 ENCOUNTER — Telehealth: Payer: Self-pay | Admitting: *Deleted

## 2021-06-17 ENCOUNTER — Telehealth: Payer: Self-pay | Admitting: Adult Health

## 2021-06-17 ENCOUNTER — Encounter: Payer: Self-pay | Admitting: Physical Therapy

## 2021-06-17 NOTE — Telephone Encounter (Signed)
[employer] is requiring a peer to peer review for both scans that were ordered. The phone number is [phone] option 3. CTA head case number is [id number]. CTA neck case number is [id number]. Our fax number is [phone]

## 2021-06-17 NOTE — Telephone Encounter (Signed)
DISABILITY FORM completed signed by JM/NP.  To medical records.

## 2021-06-17 NOTE — Telephone Encounter (Signed)
Contacted [place] [facility] and spoke with Dr. Zachery Conch for peer-to-peer approval of CTA head/neck.  Imaging approved with confirmation [id number] through 08/01/2021

## 2021-06-17 NOTE — Telephone Encounter (Signed)
Sent orders to GI

## 2021-06-18 ENCOUNTER — Ambulatory Visit: Payer: 59 | Admitting: Physical Therapy

## 2021-06-18 ENCOUNTER — Ambulatory Visit: Payer: 59

## 2021-06-18 ENCOUNTER — Other Ambulatory Visit: Payer: Self-pay

## 2021-06-18 DIAGNOSIS — R2681 Unsteadiness on feet: Secondary | ICD-10-CM | POA: Diagnosis not present

## 2021-06-18 DIAGNOSIS — R41841 Cognitive communication deficit: Secondary | ICD-10-CM

## 2021-06-18 DIAGNOSIS — R2689 Other abnormalities of gait and mobility: Secondary | ICD-10-CM

## 2021-06-18 DIAGNOSIS — R42 Dizziness and giddiness: Secondary | ICD-10-CM

## 2021-06-18 NOTE — Therapy (Signed)
[facility] [address] [place], [place], [address] Phone: [phone]   Fax:  [phone]  Speech Language Pathology Treatment  Patient Details  Name: Caroline Graham MRN: [id number] Date of Birth: April 11, 1991 Referring Provider (SLP): Ihor Austin, NP   Encounter Date: 06/18/2021   End of Session - 06/18/21 1717     Visit Number 2    Number of Visits 9    Date for SLP Re-Evaluation 08/13/21    Authorization Time Period 90 PT/ST/OT    SLP Start Time 1703    SLP Stop Time  1750    SLP Time Calculation (min) 47 min    Activity Tolerance Patient tolerated treatment well             History reviewed. No pertinent past medical history.  Past Surgical History:  Procedure Laterality Date   KNEE SURGERY     TONSILLECTOMY      There were no vitals filed for this visit.   Subjective Assessment - 06/18/21 1724     Subjective "I need to have good attention to detail"    Currently in Pain? No/denies                   ADULT SLP TREATMENT - 06/18/21 1724       General Information   Behavior/Cognition Alert;Cooperative;Pleasant mood      Treatment Provided   Treatment provided Cognitive-Linquistic      Cognitive-Linquistic Treatment   Treatment focused on Cognition;Patient/family/caregiver education    Skilled Treatment SLP requested further analysis of cognitive skills necessary to return to work successfully, in which pt ID'd attention to detail, problem solving, and organization. Pt must follow specific standards for products, in which pt oversees and implements. SLP targeted attention to detail tasks, in which pt able to complete with 91% accuracy with rare min A. Pt demonstrated good reasoning abilities to justify responses. Pt did indicate difficulty with recalling if she has fed her dogs and completion of Constant Therapy task to recall pattern for quick flashing lights. Pt did  successfully drive newer route without need for GPS and pt is successfully using alarms to manage medications.      Assessment / Recommendations / Plan   Plan Continue with current plan of care;Goals updated      Progression Toward Goals   Progression toward goals Progressing toward goals              SLP Education - 06/18/21 1756     Education Details high level functional tasks, HEP    Person(s) Educated Patient    Methods Demonstration;Explanation;Handout    Comprehension Verbalized understanding;Returned demonstration;Need further instruction              SLP Short Term Goals - 06/18/21 1717       SLP SHORT TERM GOAL #1   Title Pt will utilize 3 memory and attention compensations to reduce number of incomplete or forgotten tasks <5 per week with occasional min A over 2 sessions    Time 4    Period Weeks    Status On-going      SLP SHORT TERM GOAL #2   Title Pt will demonstrate attention to detail and problem solving for mod complex therapeutic tasks with 80% accuracy given rare min A over 2 sessions    Time 4    Period Weeks    Status Revised      SLP SHORT TERM GOAL #3   Title  Pt will verbally generate and carryover solutions to problems encountered in everyday living and work environment with occasional min A over 2 sessions    Time 4    Period Weeks    Status On-going              SLP Long Term Goals - 06/18/21 1718       SLP LONG TERM GOAL #1   Title Pt will utilize 3 memory and attention compensations to reduce number of incomplete or forgotten tasks <2 per week with rare min A over 2 sessions    Time 8    Period Weeks    Status On-going      SLP LONG TERM GOAL #2   Title Pt will demonstrate attention to detail and problem solving for mod complex therapeutic tasks with 95% accuracy given rare min A over 2 sessions    Time 8    Period Weeks    Status Revised      SLP LONG TERM GOAL #3   Title Pt will verbally generate and carryover  solutions to problems encountered in everyday living and work environment with rare min A over 2 sessions    Time 8    Period Weeks    Status On-going              Plan - 06/18/21 1719     Clinical Impression Statement Mckinsley presents for OPST evaluation secondary to change in cognition s/p CVA in April 2022. Pt provided additional details of job and necessary cognitive skills to successfully remain in role, including attention to detail, organization, and problem solving. SLP targeted attention to detail task, in which pt able complete with 91% accuracy given rare min A. Pt continues to report some difficulty with recall related to home and Constant Therapy tasks. Given high level cognitive deficits demonstrated and reported, skilled ST is warranted to address cognitive linguistic skills impacting pt's ability to successfully return to work and school.    Speech Therapy Frequency 1x /week    Duration 8 weeks    Treatment/Interventions Compensatory strategies;Functional tasks;Cueing hierarchy;Compensatory techniques;Internal/external aids;SLP instruction and feedback;Cognitive reorganization;Multimodal communcation approach    Potential to Achieve Goals Good    SLP Home Exercise Plan provided    Consulted and Agree with Plan of Care Patient             Patient will benefit from skilled therapeutic intervention in order to improve the following deficits and impairments:   Cognitive communication deficit    Problem List Patient Active Problem List   Diagnosis Date Noted   Cerebellar infarct [facility]) 04/09/2021    Janann Colonel, MA CCC-SLP 06/18/2021, 5:58 PM  [facility] [address] [place], [place], [address] Phone: [phone]   Fax:  [phone]   Name: Caroline Graham MRN: [id number] Date of Birth: 1991/01/20

## 2021-06-18 NOTE — Therapy (Signed)
[facility] [address] [place], [place], [address] Phone: [phone]   Fax:  [phone]  Physical Therapy Treatment  Patient Details  Name: Caroline Graham MRN: [id number] Date of Birth: 1991-07-21 Referring Provider (PT): Ihor Austin, NP)   Encounter Date: 06/18/2021   PT End of Session - 06/18/21 1623     Visit Number 13    Number of Visits 15    Date for PT Re-Evaluation 07/04/21    Authorization Type [employer] combined visits PT, OT, speech    PT Start Time 1622    PT Stop Time 1704    PT Time Calculation (min) 42 min    Activity Tolerance Patient tolerated treatment well   slight increase in symptoms with standing on foam, visual tracking with head motion opposite of target motion   Behavior During Therapy [facility] for tasks assessed/performed             No past medical history on file.  Past Surgical History:  Procedure Laterality Date   KNEE SURGERY     TONSILLECTOMY      There were no vitals filed for this visit.   Subjective Assessment - 06/18/21 1623     Subjective Have been doing a little more driving, this time on the highway.  A little more intense symptoms of shakiness and head oscillating when I went into the store.  Not having headaches as much.  Have been doing HEP at home (on solid surface) and triggers symptoms to about 2/10.    Pertinent History Left vertebral Artery Dissection    Patient Stated Goals Pt's goals for PT are to regain coordination and balance; build more confidence.    Currently in Pain? No/denies    Pain Onset 1 to 4 weeks ago                     Reviewed HEP: Standing VOR Cancellation - 2-3 x daily - 7 x weekly - 1-2 sets - 10 reps             -head/eye movements moving with target- standing solid surface, no increase in symptoms, 1/0 symptoms standing on Airex feet apart> 1/10 standing on Airex feet together, 10 reps each             -head  movements moving opposite target and eyes following target- minimal increase in symptoms, 1/0 symptoms standing on Airex feet apart> 2/10 standing on Airex feet together, 10 reps each  Pt currently standing on solid surface at home to perform; (educated to stand on pillow in corner at home)-and progress feet apart>feet together   Standing in Dentist, for busy surroundings:  targets on wall, lateral head turns x 10 reps, feet apart no symptoms, feet together symptoms 1/10 Diagonal head motions to targets feet apart/together x 5 reps each, then V-head movements x 10 reps          [facility] Adult PT Treatment/Exercise - 06/18/21 0001       Ambulation/Gait   Ambulation/Gait Yes    Ambulation/Gait Assistance 6: Modified independent (Device/Increase time)    Ambulation Distance (Feet) 460 Feet   x 3 reps   Assistive device None    Gait Pattern Step-through pattern    Ambulation Surface Level;Indoor    Gait Comments Gait with vestibular activities, using star ball, side to side head and ball motion x 3 laps, then ball in V-motion x 2 laps, then trunk rotation taking ball and  giving ball x 2 laps; gait with walking poles to increase arm swing x 3 laps.  All with conversation tasks:  dinner menu, cookout menu, vegetarian dishes she has been cooking.  Pt reports slight symtpoms and requests rest break about half way through gait activities.  HR 111 bpm with activity.      Knee/Hip Exercises: Aerobic   Stepper Seated Stepper, Level 2, 4 extremities x 8 minutes, for improved overal motion of legs, arm, gentle head motion and conversation.  No increase in symptoms with head motions.                    PT Education - 06/18/21 1819     Education Details Educated on how to upgrade/progress HEP-see instructions    Person(s) Educated Patient    Methods Explanation;Demonstration;Handout    Comprehension Verbalized understanding;Returned demonstration              PT Short Term  Goals - 06/05/21 1502       PT SHORT TERM GOAL #1   Title STGs = LTGs (06/04/2021 recert)    Baseline --    Status --      PT SHORT TERM GOAL #2   Title --    Baseline --    Status --      PT SHORT TERM GOAL #3   Title --    Baseline --    Status --      PT SHORT TERM GOAL #4   Title --    Baseline --    Status --               PT Long Term Goals - 06/05/21 1513       PT LONG TERM GOAL #1   Title Pt will be independent with progression of HEP for improved strength, balance, transfers, and gait.  TARGET 07/03/2021    Baseline independent with HEP as progressing through therapy; updates provided 06/04/2021    Time 4    Period Weeks    Status On-going      PT LONG TERM GOAL #2   Title Pt will improve Sensory Organization test composite score to WNL, for improved overall balance, vestibular system/preference use for balance.    Baseline Composite score 67    Time 4    Status New      PT LONG TERM GOAL #3   Title Pt will perform at least 20 minutes of continuous exercise/activity/gait tasks with no reported increase in vestibular symptoms.    Baseline varies day to day    Time 4    Period Weeks    Status On-going      PT LONG TERM GOAL #4   Title FOTO score for lower extremity to improve to at least 77 for improve overall return to PLOF.    Baseline 92 on FOTO 06/04/2021    Time 8    Period Weeks    Status Achieved                   Plan - 06/18/21 1820     Clinical Impression Statement Pt continues to gradually increase activity level outside of therapy, but does note that at certain points with significantly busy surroundings (driving on busier road and at baseball game), once the symptoms start, they do not stop until she can get to a quiet place to rest.  In therapy session, compliant surface and narrowed BOS tends to bring on symptoms, but only  up to 2/10 rating.  Educated pt to progress exercises on compliant surfaces at home and to perform HEP on  days when she has done more, or at the end of the day, with faitgue, to try to bring symptoms up to slightly higher level to help with habituation.  She is continueing to benefit from skilled PT to help progress towards LTGs and overall improved functional mobility.    Personal Factors and Comorbidities Comorbidity 1    Comorbidities hx of knee surgery (L)    Examination-Activity Limitations Locomotion Level;Transfers;Squat;Carry    Examination-Participation Restrictions Community Activity;Driving    Stability/Clinical Decision Making Stable/Uncomplicated    Rehab Potential Good    PT Frequency 1x / week    PT Duration 4 weeks   per recert 06/04/2021   PT Treatment/Interventions ADLs/Self Care Home Management;Gait training;Stair training;Functional mobility training;Therapeutic activities;Therapeutic exercise;Balance training;Neuromuscular re-education;Patient/family education;Vestibular    PT Next Visit Plan How update to HEP go on compliant surfaces? Try activities in session that would simulate busier surroundings (arm swing motions with head turns, use Boomwhackers, try zoom ball or hula hoops.  Gait with x 1 and x2 viewing.  Standing on ramp with narrowed/tandem stance on compliant surfaces for activities    Consulted and Agree with Plan of Care Patient             Patient will benefit from skilled therapeutic intervention in order to improve the following deficits and impairments:  Abnormal gait, Decreased mobility, Decreased strength, Dizziness, Difficulty walking, Decreased activity tolerance  Visit Diagnosis: Unsteadiness on feet  Dizziness and giddiness  Other abnormalities of gait and mobility     Problem List Patient Active Problem List   Diagnosis Date Noted   Cerebellar infarct (HCC) 04/09/2021    Zyasia Halbleib W. 06/18/2021, 6:24 PM Gean Maidens., PT  [facility] [address] [place], [place],  [address] Phone: [phone]   Fax:  [phone]  Name: Caroline Graham MRN: [id number] Date of Birth: 1991-07-21

## 2021-06-18 NOTE — Patient Instructions (Signed)
With the exercises I gave for the HEP last visit:  -stand on a pillow with feet apart, then feet together  -Try to do these at the end of the day, when you are feeling somewhat tired, to bring the symptoms on more to about 5-6/10

## 2021-07-02 ENCOUNTER — Other Ambulatory Visit: Payer: Self-pay

## 2021-07-02 ENCOUNTER — Ambulatory Visit: Payer: 59 | Attending: Internal Medicine | Admitting: Physical Therapy

## 2021-07-02 ENCOUNTER — Ambulatory Visit: Payer: 59

## 2021-07-02 ENCOUNTER — Encounter: Payer: Self-pay | Admitting: Physical Therapy

## 2021-07-02 DIAGNOSIS — R2681 Unsteadiness on feet: Secondary | ICD-10-CM | POA: Diagnosis present

## 2021-07-02 DIAGNOSIS — R42 Dizziness and giddiness: Secondary | ICD-10-CM | POA: Diagnosis not present

## 2021-07-02 DIAGNOSIS — R41841 Cognitive communication deficit: Secondary | ICD-10-CM | POA: Insufficient documentation

## 2021-07-02 NOTE — Patient Instructions (Signed)
Constant Therapy: try discount code SUN20 for 20%  If that doesn't work, call and ask about a scholarship. I have had patients get them in the past, so it is worth a try

## 2021-07-02 NOTE — Therapy (Signed)
[facility] [address] [place], [place], [address] Phone: [phone]   Fax:  [phone]  Speech Language Pathology Treatment  Patient Details  Name: Caroline Graham MRN: [id number] Date of Birth: 02/21/1991 Referring Provider (SLP): Ihor Austin, NP   Encounter Date: 07/02/2021   End of Session - 07/02/21 1638     Visit Number 3    Number of Visits 9    Date for SLP Re-Evaluation 08/13/21    Authorization Time Period 90 PT/ST/OT    SLP Start Time 1704    SLP Stop Time  1745    SLP Time Calculation (min) 41 min    Activity Tolerance Patient tolerated treatment well             No past medical history on file.  Past Surgical History:  Procedure Laterality Date   KNEE SURGERY     TONSILLECTOMY      There were no vitals filed for this visit.   Subjective Assessment - 07/02/21 1704     Subjective "I now have to double check myself"    Currently in Pain? No/denies                   ADULT SLP TREATMENT - 07/02/21 1637       General Information   Behavior/Cognition Alert;Cooperative;Pleasant mood      Treatment Provided   Treatment provided Cognitive-Linquistic      Cognitive-Linquistic Treatment   Treatment focused on Cognition;Patient/family/caregiver education    Skilled Treatment Pt reports improvements in recall for feeding dogs and taking medications,with alarms in place in case pt forgets. Pt reported frustrating episode related to functional calculations and problem solving. Pt able to eliminate problem with use of visual tracker for attention. Pt aware of need to double check all tasks with some intermittent errors noted. Pt reported one attention to detail missed on HEP. SLP targeted high level planning and organizing tasks, in which pt able to complete with mild additional processing time and rare self-corrections for calculations. Pt demonstrated overall good attention to  detail.      Assessment / Recommendations / Plan   Plan Continue with current plan of care     Progression Toward Goals   Progression toward goals Progressing toward goals              SLP Education - 07/02/21 1750     Education Details read 1x to see if you absorb information, timed tasks, double check    Person(s) Educated Patient    Methods Explanation;Demonstration;Handout    Comprehension Verbalized understanding;Returned demonstration;Need further instruction              SLP Short Term Goals - 07/02/21 1639       SLP SHORT TERM GOAL #1   Title Pt will utilize 3 memory and attention compensations to reduce number of incomplete or forgotten tasks <5 per week with occasional min A over 2 sessions    Baseline 07-02-21    Time 3    Period Weeks    Status On-going      SLP SHORT TERM GOAL #2   Title Pt will demonstrate attention to detail and problem solving for mod complex therapeutic tasks with 80% accuracy given rare min A over 2 sessions    Baseline 07-02-21    Time 3    Period Weeks    Status On-going      SLP SHORT TERM GOAL #3   Title  Pt will verbally generate and carryover solutions to problems encountered in everyday living and work environment with occasional min A over 2 sessions    Baseline 07-02-21    Time 3    Period Weeks    Status On-going              SLP Long Term Goals - 07/02/21 1639       SLP LONG TERM GOAL #1   Title Pt will utilize 3 memory and attention compensations to reduce number of incomplete or forgotten tasks <2 per week with rare min A over 2 sessions    Time 7    Period Weeks    Status On-going      SLP LONG TERM GOAL #2   Title Pt will demonstrate attention to detail and problem solving for mod complex therapeutic tasks with 95% accuracy given rare min A over 2 sessions    Time 7    Period Weeks    Status On-going      SLP LONG TERM GOAL #3   Title Pt will verbally generate and carryover solutions to problems  encountered in everyday living and work environment with rare min A over 2 sessions    Time 7    Period Weeks    Status On-going              Plan - 07/02/21 1639     Clinical Impression Statement Caroline Graham presents for OPST intervention secondary to change in cognition s/p CVA in April 2022. SLP targeted high level problem solving tasks with attention to detail, in which pt able complete with good accuracy given some additional processing time. Pt c/o not feeling "sharp" and needing to reread information again in order to "absorb" it. SLP provided feedback and recommendations to aid error awareness and processing. Given high level cognitive deficits demonstrated and reported, skilled ST is warranted to address cognitive linguistic skills impacting pt's ability to successfully return to work and school.    Speech Therapy Frequency 1x /week    Duration 8 weeks    Treatment/Interventions Compensatory strategies;Functional tasks;Cueing hierarchy;Compensatory techniques;Internal/external aids;SLP instruction and feedback;Cognitive reorganization;Multimodal communcation approach    Potential to Achieve Goals Good    SLP Home Exercise Plan provided    Consulted and Agree with Plan of Care Patient             Patient will benefit from skilled therapeutic intervention in order to improve the following deficits and impairments:   Cognitive communication deficit    Problem List Patient Active Problem List   Diagnosis Date Noted   Cerebellar infarct [facility]) 04/09/2021    Janann Colonel, MA CCC-SLP 07/02/2021, 5:56 PM  [facility] [address] [place], [place], [address] Phone: [phone]   Fax:  [phone]   Name: Caroline Graham MRN: [id number] Date of Birth: August 20, 1991

## 2021-07-03 NOTE — Therapy (Signed)
[facility] [address] [place], [place], [address] Phone: [phone]   Fax:  [phone]  Physical Therapy Treatment  Patient Details  Name: Caroline Graham MRN: [id number] Date of Birth: 1991-06-13 Referring Provider (PT): Ihor Austin, NP)   Encounter Date: 07/02/2021   PT End of Session - 07/02/21 1610     Visit Number 14    Number of Visits 15    Date for PT Re-Evaluation 07/04/21    Authorization Type [employer]-90 combined visits PT, OT, speech    PT Start Time 1616    PT Stop Time 1702    PT Time Calculation (min) 46 min    Activity Tolerance Patient tolerated treatment well   slight increase in symptoms with standing on foam, visual tracking with head motion opposite of target motion   Behavior During Therapy [facility] for tasks assessed/performed             History reviewed. No pertinent past medical history.  Past Surgical History:  Procedure Laterality Date   KNEE SURGERY     TONSILLECTOMY      There were no vitals filed for this visit.   Subjective Assessment - 07/02/21 1609     Subjective Been doing a little more over the holiday and the last week.  Was out in the heat a bit and that brought on the throbbing.  Wnet into the pool; it was hard to look at the steps/water, getting in and out.  Tried some golfing/hitting balls at the driving range-it was not bad, but I can tell my endurance is so low.  Over the past week, my symptoms are much worse-when I'm driving -it's up to about 4-5/10 ("swooshiness"); can take 20-30 minutes to settle    Pertinent History Left vertebral Artery Dissection    Patient Stated Goals Pt's goals for PT are to regain coordination and balance; build more confidence.    Currently in Pain? Yes    Pain Score 2     Pain Location Head   neck   Pain Orientation Right    Pain Descriptors / Indicators Headache    Pain Type Acute pain    Pain Onset 1 to 4 weeks ago    Pain  Frequency Intermittent    Aggravating Factors  visually straining, busy environment    Pain Relieving Factors rest, tylenol, dark room                               [facility] Adult PT Treatment/Exercise - 07/02/21 1620       Therapeutic Activites    Therapeutic Activities Other Therapeutic Activities    Other Therapeutic Activities Discussed pt's symptoms (tends to have increased motion sensitivity with driving/riding, busy surroundings such as stores, grocery stores, getting in and out of pool area); she also notes sensitivity to heat and overall continued decrease in endurance for activity.  When she has increase in symptoms in busy surroundings (described above), it takes about 20-30 minutes to resolve per report.  Discussed overall progress and that pt does not appear to be at her baseline yet, and that she would benefit from continued skilled PT, likely with transition onto vestibular-specializing therapist's schedule to further progress pt's motion and activity tolerance.  Pt in agreement after next visit.              NMR:    Standing on incline solid surface:  feet apart,  feet together EC x 10 seconds, then partial tandem stance EC x 10 seconds.  On incline/decline compliant surface:  repeated the above with increased sway partial tandem stance EC with RLE posterior.  Alternating forward step taps x 10, marching in place x 10, then marching in place added arm swing x 10, then marching in place with alt hand to knee taps x 10. Side stepping up and down ramp x 3, with added head turns, x 3 reps (rates symptoms as 1/10 when L foot leading down).   Then with added head nods x 3 reps, no symptoms (L foot leading down; 2/10 symptoms R foot leading down) , then sidestepping  with arms horizontal abduction x 3, no increase in symptoms.  Standing on blue mat holding ball, with 3 cones in front of patient:  squats to touch each ball>stand, then 90 degree turn, repeat with  squats>stand, full circle completed in clockwise/counterclockwise directions, performed x 1 rep with symptoms rated at 1/10, then performed 2 reps, no stopping between, with symptoms rates 2/10  Short distance gait at end of session, 350 ft, around track, then quick change of directions, maneuvering around furniture, with gentle head turns to look at cards (with added cognitive task of addition/subtraction of numbers on cards).  Slowed pace with gait as cognitive load increases.  (Pt does say she is not good at math).    PT Education - 07/03/21 0759     Education Details See therapeutic activity notes    Person(s) Educated Patient    Methods Explanation    Comprehension Verbalized understanding              PT Short Term Goals - 06/05/21 1502       PT SHORT TERM GOAL #1   Title STGs = LTGs (06/04/2021 recert)    Baseline --    Status --      PT SHORT TERM GOAL #2   Title --    Baseline --    Status --      PT SHORT TERM GOAL #3   Title --    Baseline --    Status --      PT SHORT TERM GOAL #4   Title --    Baseline --    Status --               PT Long Term Goals - 06/05/21 1513       PT LONG TERM GOAL #1   Title Pt will be independent with progression of HEP for improved strength, balance, transfers, and gait.  TARGET 07/03/2021    Baseline independent with HEP as progressing through therapy; updates provided 06/04/2021    Time 4    Period Weeks    Status On-going      PT LONG TERM GOAL #2   Title Pt will improve Sensory Organization test composite score to WNL, for improved overall balance, vestibular system/preference use for balance.    Baseline Composite score 67    Time 4    Status New      PT LONG TERM GOAL #3   Title Pt will perform at least 20 minutes of continuous exercise/activity/gait tasks with no reported increase in vestibular symptoms.    Baseline varies day to day    Time 4    Period Weeks    Status On-going      PT LONG TERM GOAL  #4   Title FOTO score for lower extremity to improve to  at least 77 for improve overall return to PLOF.    Baseline 92 on FOTO 06/04/2021    Time 8    Period Weeks    Status Achieved                   Plan - 07/03/21 0802     Clinical Impression Statement Pt has not been seen since 06/18/21 visit, due to holiday week last week/scheduling conflict.  She continues to report attempts increased activity level, but she continues to report 2/10 up to 4-5/10 symptoms with driving/riding, trying to get into and out of swimming pool, grocery store (continues to be with busy surroundings, visual conflict activities).  Skilled PT session today focused on compliant surface gentle head motions, arm motions, dynamic activities, static activities with vision removed, with symptoms increasing up to 2/10, but resolving within 1-2 minutes.  Discussed assessing goals next visit, but pt agrees with therapist that she is not at baseline and discussed transititioning to specific vestibular therapist's schedule for further progression towards reduction in symptoms and improved return to prior activity tolerance and independence.    Personal Factors and Comorbidities Comorbidity 1    Comorbidities hx of knee surgery (L)    Examination-Activity Limitations Locomotion Level;Transfers;Squat;Carry    Examination-Participation Restrictions Community Activity;Driving    Stability/Clinical Decision Making Stable/Uncomplicated    Rehab Potential Good    PT Frequency 1x / week    PT Duration 4 weeks   per recert 06/04/2021   PT Treatment/Interventions ADLs/Self Care Home Management;Gait training;Stair training;Functional mobility training;Therapeutic activities;Therapeutic exercise;Balance training;Neuromuscular re-education;Patient/family education;Vestibular    PT Next Visit Plan Try activities in session that would simulate busier surroundings (arm swing motions with head turns, use Boomwhackers, try zoom ball or hula  hoops.  Gait with x 1 and x2 viewing.  Standing on ramp with narrowed/tandem stance on compliant surfaces for activities.  Check goals; try to get on Brook's/Audra's schedule for progression of vestibular/visual based activities    Consulted and Agree with Plan of Care Patient             Patient will benefit from skilled therapeutic intervention in order to improve the following deficits and impairments:  Abnormal gait, Decreased mobility, Decreased strength, Dizziness, Difficulty walking, Decreased activity tolerance  Visit Diagnosis: Dizziness and giddiness  Unsteadiness on feet     Problem List Patient Active Problem List   Diagnosis Date Noted   Cerebellar infarct ([facility]) 04/09/2021    Areebah Meinders W. 07/03/2021, 8:10 AM Gean Maidens., PT   [facility] [address] [place], [place], [address] Phone: [phone]   Fax:  [phone]  Name: Caroline Graham MRN: [id number] Date of Birth: [birth year]

## 2021-07-09 ENCOUNTER — Ambulatory Visit: Payer: 59 | Admitting: Physical Therapy

## 2021-07-09 ENCOUNTER — Ambulatory Visit: Payer: 59

## 2021-07-10 ENCOUNTER — Encounter: Payer: Self-pay | Admitting: Rehabilitative and Restorative Service Providers"

## 2021-07-10 ENCOUNTER — Other Ambulatory Visit: Payer: Self-pay

## 2021-07-10 ENCOUNTER — Ambulatory Visit: Payer: 59 | Admitting: Rehabilitative and Restorative Service Providers"

## 2021-07-10 DIAGNOSIS — R42 Dizziness and giddiness: Secondary | ICD-10-CM | POA: Diagnosis not present

## 2021-07-10 DIAGNOSIS — R2681 Unsteadiness on feet: Secondary | ICD-10-CM

## 2021-07-10 NOTE — Therapy (Signed)
[facility] [address] [place], [place], [address] Phone: [phone]   Fax:  [phone]  Physical Therapy Treatment AND Renewal Summary  Patient Details  Name: Caroline Graham MRN: [id number] Date of Birth: 1991/01/02 Referring Provider (PT): Frann Rider, NP)   Encounter Date: 07/10/2021   PT End of Session - 07/10/21 1538     Visit Number 15    Number of Visits 15    Date for PT Re-Evaluation 07/04/21    Authorization Type UHC-90 combined visits PT, OT, speech    PT Start Time 8921 to 1615   Activity Tolerance Patient tolerated treatment well   slight increase in symptoms with standing on foam, visual tracking with head motion opposite of target motion   Behavior During Therapy [facility] for tasks assessed/performed             History reviewed. No pertinent past medical history.  Past Surgical History:  Procedure Laterality Date   KNEE SURGERY     TONSILLECTOMY      There were no vitals filed for this visit.   Subjective Assessment - 07/10/21 1535     Subjective The patient went into hobby lobby and after 8 minutes she gets a sensation of a "side to side shaking" sensation.  She feels dyscoordinated.  The longer she is exposed to the busy environment, the worse it gets. She tries to settle symtpoms by using visual fixation.    Pertinent History Left vertebral Artery Dissection    Patient Stated Goals Pt's goals for PT are to regain coordination and balance; build more confidence.    Currently in Pain? No/denies                [facility] PT Assessment - 07/10/21 1550       Assessment   Medical Diagnosis cerebellar infarction-L inferioro    Referring Provider (PT) Frann Rider, NP)    Onset Date/Surgical Date 04/09/21      Standardized Balance Assessment   Balance Master Testing Sensory Organization Test   75% scoring WNLs for age/height norms                           [facility] Adult PT Treatment/Exercise - 07/10/21 0001       Ambulation/Gait   Ambulation/Gait Yes    Ambulation/Gait Assistance 6: Modified independent (Device/Increase time)    Gait Comments Gaze x 2 viewing with gait activities      Neuro Re-ed    Neuro Re-ed Details  Gaze x 1 and x 2 viewing and VOR cancellation with surround movement on balance master set at 100% random with patient standing on BOSU for multi-sensory training.             Vestibular Treatment/Exercise - 07/10/21 1550       Vestibular Treatment/Exercise   Vestibular Treatment Provided Gaze;Habituation    Gaze Exercises Eye/Head Exercise Horizontal;X2 Viewing Horizontal      X2 Viewing Horizontal   Foot Position Standing on solid surface, then foam surface, then on BOSU     Comments x 2 viewing with busy background      Eye/Head Exercise Horizontal   Foot Position Standing on solid surface, then foam surface, then on BOSU    Comments VOR cancellation                        PT Long Term Goals - 07/10/21 1547  PT LONG TERM GOAL #1   Title Pt will be independent with progression of HEP for improved strength, balance, transfers, and gait.  TARGET 07/03/2021    Baseline --    Time 4    Period Weeks    Status Achieved      PT LONG TERM GOAL #2   Title Pt will improve Sensory Organization test composite score to WNL, for improved overall balance, vestibular system/preference use for balance.    Baseline Composite score 67 improved to 75%    Time 4    Status Achieved      PT LONG TERM GOAL #3   Title Pt will perform at least 20 minutes of continuous exercise/activity/gait tasks with no reported increase in vestibular symptoms.    Baseline varies depending on sleep, environment    Time 4    Period Weeks    Status Partially Met      PT LONG TERM GOAL #4   Title FOTO score for lower extremity to improve to at least 77 for improve overall return to PLOF.    Baseline 92 on FOTO 06/04/2021     Time 8    Period Weeks    Status Achieved             STG:   PT Short Term Goals - 07/10/21 1714       PT SHORT TERM GOAL #1   Title The patient will continue to progress with HEP.    Time 3    Period Weeks    Status Revised    Target Date 07/31/21             UPDATED LONG TERM GOALS:    PT Long Term Goals - 07/10/21 1711       PT LONG TERM GOAL #1   Title The patient will be indep with progression of HEP.    Time 6    Period Weeks    Status Revised    Target Date 08/21/21      PT LONG TERM GOAL #2   Title The patient will report tolerance to busy store environments x 20 minutes for return to community tasks.    Baseline Symptoms within 8 minutes    Time 6    Period Weeks    Status New    Target Date 08/21/21      PT LONG TERM GOAL #3   Title Pt will perform at least 20 minutes of continuous exercise/activity/gait tasks with no reported increase in vestibular symptoms.    Baseline varies depending on sleep, environment    Time 6    Period Weeks    Status Revised    Target Date 08/21/21                 Plan - 07/10/21 1653     Clinical Impression Statement The patient has partially met LTGs.  She continues with dizziness of moderate intensity after driving on the highway or within 8-10 minutes of entering a busy store.  PT progressed HEP focusing on visual motion sensitivity, dynamic gaze activities and plans to work on task specific training in the home (ceiling fan and in stores).    PT Treatment/Interventions ADLs/Self Care Home Management;Gait training;Stair training;Functional mobility training;Therapeutic activities;Therapeutic exercise;Balance training;Neuromuscular re-education;Patient/family education;Vestibular    PT Next Visit Plan Try activities in session that would simulate busier surroundings (arm swing motions with head turns, use Boomwhackers, try zoom ball or hula hoops.  Gait with x 1 and  x2 viewing.  Standing on ramp with  narrowed/tandem stance on compliant surfaces for activities.  Check goals; try to get on Brook's/Audra's schedule for progression of vestibular/visual based activities    Consulted and Agree with Plan of Care Patient             Patient will benefit from skilled therapeutic intervention in order to improve the following deficits and impairments:     Visit Diagnosis: Dizziness and giddiness  Unsteadiness on feet     Problem List Patient Active Problem List   Diagnosis Date Noted   Cerebellar infarct ([place]) 04/09/2021    Jovaughn Wojtaszek, PT 07/10/2021, 5:11 PM  [place] [address] [place], [place], [address] Phone: [phone]   Fax:  [phone]  Name: Caroline Graham MRN: [id number] Date of Birth: [birth year]

## 2021-07-16 ENCOUNTER — Other Ambulatory Visit: Payer: Self-pay

## 2021-07-16 ENCOUNTER — Ambulatory Visit: Payer: 59

## 2021-07-16 DIAGNOSIS — R41841 Cognitive communication deficit: Secondary | ICD-10-CM

## 2021-07-16 DIAGNOSIS — R42 Dizziness and giddiness: Secondary | ICD-10-CM | POA: Diagnosis not present

## 2021-07-16 NOTE — Patient Instructions (Addendum)
  Ted Talks- think about watching videos that you would learn something new (job, school, life, etc) Watch longer videos (15+ minute) Try some at normal speed and some at the highest speed you can tolerate   Try a medium challenge crochet pattern    Try a recipe with new substitutions that would work for both you and your husband

## 2021-07-16 NOTE — Therapy (Signed)
[facility] [address] [place], [place], [address] Phone: [phone]   Fax:  [phone]  Speech Language Pathology Treatment  Patient Details  Name: Caroline Graham MRN: [id number] Date of Birth: 1991/02/16 Referring Provider (SLP): Ihor Austin, NP   Encounter Date: 07/16/2021   End of Session - 07/16/21 1609     Visit Number 4    Number of Visits 9    Date for SLP Re-Evaluation 08/13/21    Authorization Time Period 90 PT/ST/OT    SLP Start Time 1610    SLP Stop Time  1655    SLP Time Calculation (min) 45 min    Activity Tolerance Patient tolerated treatment well             No past medical history on file.  Past Surgical History:  Procedure Laterality Date   KNEE SURGERY     TONSILLECTOMY      There were no vitals filed for this visit.   Subjective Assessment - 07/16/21 1612     Subjective "Nothing new"    Currently in Pain? No/denies                   ADULT SLP TREATMENT - 07/16/21 1608       General Information   Behavior/Cognition Alert;Cooperative;Pleasant mood      Treatment Provided   Treatment provided Cognitive-Linquistic      Cognitive-Linquistic Treatment   Treatment focused on Cognition;Patient/family/caregiver education    Skilled Treatment Pt indicated some recent rare word finding episodes, for example when talking to family member on phone. SLP to monitor. SLP discussed various activities to engage cognitive functioning, including watching Felicity Pellegrini Talks to mimic lectures for school and return to crocheting for attention to detail and problem solving. Pt verbalized understanding and agreement. SLP assessed recall of article with novel infromation with good recall and attention to detail demonstrated. Good problem solving and mental flexability exhibited when prompted.      Assessment / Recommendations / Plan   Plan Continue with current plan of care       Progression Toward Goals   Progression toward goals Progressing toward goals              SLP Education - 07/16/21 1708     Education Details functional tasks to apply cognitive skills, challenging tasks    Person(s) Educated Patient    Methods Explanation;Demonstration    Comprehension Verbalized understanding;Returned demonstration              SLP Short Term Goals - 07/16/21 1609       SLP SHORT TERM GOAL #1   Title Pt will utilize 3 memory and attention compensations to reduce number of incomplete or forgotten tasks <5 per week with occasional min A over 2 sessions    Baseline 07-02-21    Time 2    Period Weeks    Status On-going      SLP SHORT TERM GOAL #2   Title Pt will demonstrate attention to detail and problem solving for mod complex therapeutic tasks with 80% accuracy given rare min A over 2 sessions    Baseline 07-02-21    Time 2    Period Weeks    Status On-going      SLP SHORT TERM GOAL #3   Title Pt will verbally generate and carryover solutions to problems encountered in everyday living and work environment with occasional min A over 2 sessions    Baseline  07-02-21    Time 2    Period Weeks    Status On-going              SLP Long Term Goals - 07/16/21 1609       SLP LONG TERM GOAL #1   Title Pt will utilize 3 memory and attention compensations to reduce number of incomplete or forgotten tasks <2 per week with rare min A over 2 sessions    Time 6    Period Weeks    Status On-going      SLP LONG TERM GOAL #2   Title Pt will demonstrate attention to detail and problem solving for mod complex therapeutic tasks with 95% accuracy given rare min A over 2 sessions    Time 6    Period Weeks    Status On-going      SLP LONG TERM GOAL #3   Title Pt will verbally generate and carryover solutions to problems encountered in everyday living and work environment with rare min A over 2 sessions    Time 6    Period Weeks    Status On-going               Plan - 07/16/21 1657     Clinical Impression Statement Caroline Graham presents for OPST intervention secondary to change in cognition s/p CVA in April 2022. SLP targeted recall of novel information, in which pt able to immediately recall with good accuracy. SLP recommended activities to pursue at home to utilize high level thinking skills. Rare word finding episodes reported this session. Given high level cognitive deficits demonstrated and reported, skilled ST is warranted to address cognitive linguistic skills impacting pt's ability to successfully return to work and school.    Speech Therapy Frequency 1x /week    Duration 8 weeks    Treatment/Interventions Compensatory strategies;Functional tasks;Cueing hierarchy;Compensatory techniques;Internal/external aids;SLP instruction and feedback;Cognitive reorganization;Multimodal communcation approach    Potential to Achieve Goals Good    SLP Home Exercise Plan provided    Consulted and Agree with Plan of Care Patient             Patient will benefit from skilled therapeutic intervention in order to improve the following deficits and impairments:   Cognitive communication deficit    Problem List Patient Active Problem List   Diagnosis Date Noted   Cerebellar infarct (HCC) 04/09/2021    Janann Colonel, MA CCC-SLP 07/16/2021, 6:15 PM  [facility] [address] [place], [place], [address] Phone: [phone]   Fax:  [phone]   Name: Caroline Graham MRN: [id number] Date of Birth: 1991-06-19

## 2021-07-22 ENCOUNTER — Other Ambulatory Visit: Payer: Self-pay

## 2021-07-22 ENCOUNTER — Ambulatory Visit: Payer: 59 | Attending: Internal Medicine

## 2021-07-22 DIAGNOSIS — R41841 Cognitive communication deficit: Secondary | ICD-10-CM | POA: Diagnosis present

## 2021-07-22 DIAGNOSIS — R278 Other lack of coordination: Secondary | ICD-10-CM | POA: Insufficient documentation

## 2021-07-22 DIAGNOSIS — R2681 Unsteadiness on feet: Secondary | ICD-10-CM | POA: Insufficient documentation

## 2021-07-22 DIAGNOSIS — R42 Dizziness and giddiness: Secondary | ICD-10-CM | POA: Insufficient documentation

## 2021-07-22 DIAGNOSIS — R2689 Other abnormalities of gait and mobility: Secondary | ICD-10-CM | POA: Insufficient documentation

## 2021-07-22 NOTE — Therapy (Signed)
[facility] [address] [place], [place], [address] Phone: [phone]   Fax:  [phone]  Physical Therapy Treatment  Patient Details  Name: Caroline Graham MRN: [id number] Date of Birth: Jan 23, 1991 Referring Provider (PT): Ihor Austin, NP)   Encounter Date: 07/22/2021   PT End of Session - 07/22/21 1707     Visit Number 16    Number of Visits 21    Date for PT Re-Evaluation 08/21/21    Authorization Type [employer]-90 combined visits PT, OT, speech    PT Start Time 1702    PT Stop Time 1744    PT Time Calculation (min) 42 min    Activity Tolerance Patient tolerated treatment well    Behavior During Therapy [facility] for tasks assessed/performed             No past medical history on file.  Past Surgical History:  Procedure Laterality Date   KNEE SURGERY     TONSILLECTOMY      There were no vitals filed for this visit.   Subjective Assessment - 07/22/21 1708     Subjective No new changes. No dizziness at baseline. Slight HA    Pertinent History Left vertebral Artery Dissection    Patient Stated Goals Pt's goals for PT are to regain coordination and balance; build more confidence.    Currently in Pain? Yes    Pain Score 2     Pain Location Head    Pain Orientation Anterior    Pain Descriptors / Indicators Headache    Pain Type Acute pain    Pain Onset Today    Pain Frequency Intermittent    Pain Relieving Factors tylenol               [facility] Adult PT Treatment/Exercise - 07/22/21 0001       Ambulation/Gait   Ambulation/Gait Yes    Ambulation/Gait Assistance 6: Modified independent (Device/Increase time)      Therapeutic Activites    Other Therapeutic Activities With "x' stationary on patterned backgroudn, compelted horizontal gaze x 1 with forward and backward ambulation 25' x 2 reps. increased challenge noted with backward ambulation and horizontal. Then completed vertical gaze x 1 with  forward and backward ambulation 25' x 2 reps. CGA required. Standing on inverted BOSU with feet hip width completed visual tracking on ball x 10 reps CW/CCW each, then completed self ball toss with continued visual tracking x 10 reps.      Neuro Re-ed    Neuro Re-ed Details  Completed ambulation with PT following behind patient calling out R/L side and patient turning to toss/catch small ball. Completed x 230 ft with mild (3/10) dizziness with completion.             Vestibular Treatment/Exercise - 07/22/21 0001       Vestibular Treatment/Exercise   Vestibular Treatment Provided Gaze;Habituation    Habituation Exercises Seated Horizontal Head Turns    Gaze Exercises X1 Viewing Horizontal;X1 Viewing Vertical      Seated Horizontal Head Turns   Symptom Description  seated on physioball with focal point completed bouncing with eyes open x 60 seconds, then progressed to additional head turns to R/L x 10 reps. Second set completed bouncing with eyes closed x 60 seconds, followed by addition of head turns to R/L x 10 reps while maintaining eyes closed. reports very mild dizziness with eyes closed and head turn combined.      X1 Viewing Horizontal   Foot  Position standing narrow BOS on airex; tandem stance on airex (both completed with mirror background)    Reps 2    Comments x 30 seconds with narrow BOS,f ollowed by 30 seconds with tandem.      X1 Viewing Vertical   Foot Position standing narrow BOS on airex; tandem stance on airex (both completed with mirror background)    Time 0200    Comments x 30 seconds with narrow BOS,f ollowed by 30 seconds with tandem                     PT Short Term Goals - 07/10/21 1714       PT SHORT TERM GOAL #1   Title The patient will continue to progress with HEP.    Time 3    Period Weeks    Status Revised    Target Date 07/31/21               PT Long Term Goals - 07/10/21 1711       PT LONG TERM GOAL #1   Title The patient  will be indep with progression of HEP.    Time 6    Period Weeks    Status Revised    Target Date 08/21/21      PT LONG TERM GOAL #2   Title The patient will report tolerance to busy store environments x 20 minutes for return to community tasks.    Baseline Symptoms within 8 minutes    Time 6    Period Weeks    Status New    Target Date 08/21/21      PT LONG TERM GOAL #3   Title Pt will perform at least 20 minutes of continuous exercise/activity/gait tasks with no reported increase in vestibular symptoms.    Baseline varies depending on sleep, environment    Time 6    Period Weeks    Status Revised    Target Date 08/21/21                   Plan - 07/22/21 2222     Clinical Impression Statement Continued progression of VOR x 1 with ambulation, as well as progressing patterned background and stance position. Patient tolerating well with minimal dizziness. Most symptomatic in today's session with ambulation with toss/catch of ball to PT. Overall dizziness continues to stay mild. Will continue to progress toward all LTGs.    Personal Factors and Comorbidities Comorbidity 1    Comorbidities hx of knee surgery (L)    Examination-Activity Limitations Locomotion Level;Transfers;Squat;Carry    Examination-Participation Restrictions Community Activity;Driving    Rehab Potential Good    PT Frequency 1x / week    PT Duration 6 weeks    PT Treatment/Interventions ADLs/Self Care Home Management;Gait training;Stair training;Functional mobility training;Therapeutic activities;Therapeutic exercise;Balance training;Neuromuscular re-education;Patient/family education;Vestibular    PT Next Visit Plan Try activities in session that would simulate busier surroundings (arm swing motions with head turns, use Boomwhackers, try zoom ball or hula hoops.  Gait with x 1 and x 2 viewing.  Gait with Head Turns/scanning environment. Standing on ramp with narrowed/tandem stance on compliant surfaces for  activities.    Consulted and Agree with Plan of Care Patient             Patient will benefit from skilled therapeutic intervention in order to improve the following deficits and impairments:  Abnormal gait, Decreased mobility, Decreased strength, Dizziness, Difficulty walking, Decreased activity tolerance  Visit Diagnosis:  Dizziness and giddiness  Unsteadiness on feet     Problem List Patient Active Problem List   Diagnosis Date Noted   Cerebellar infarct [facility]) 04/09/2021    Tempie Donning, PT, DPT 07/22/2021, 10:26 PM  [facility] [address] [place], [place], [address] Phone: [phone]   Fax:  [phone]  Name: Caroline Graham MRN: [id number] Date of Birth: Dec 02, 1991

## 2021-07-23 ENCOUNTER — Ambulatory Visit: Payer: 59

## 2021-07-23 DIAGNOSIS — R42 Dizziness and giddiness: Secondary | ICD-10-CM | POA: Diagnosis not present

## 2021-07-23 DIAGNOSIS — R41841 Cognitive communication deficit: Secondary | ICD-10-CM

## 2021-07-23 NOTE — Therapy (Signed)
[facility] [address] [place], [place], [address] Phone: [phone]   Fax:  [phone]  Speech Language Pathology Treatment  Patient Details  Name: Caroline Graham MRN: [id number] Date of Birth: Aug 25, 1991 Referring Provider (SLP): Ihor Austin, NP   Encounter Date: 07/23/2021   End of Session - 07/23/21 1615     Visit Number 5    Number of Visits 9    Date for SLP Re-Evaluation 08/13/21    Authorization Time Period 90 PT/ST/OT    SLP Start Time 1615    SLP Stop Time  1700    SLP Time Calculation (min) 45 min    Activity Tolerance Patient tolerated treatment well             No past medical history on file.  Past Surgical History:  Procedure Laterality Date   KNEE SURGERY     TONSILLECTOMY      There were no vitals filed for this visit.   Subjective Assessment - 07/23/21 1615     Subjective "things have been good"    Currently in Pain? No/denies                   ADULT SLP TREATMENT - 07/23/21 1614       General Information   Behavior/Cognition Alert;Cooperative;Pleasant mood      Treatment Provided   Treatment provided Cognitive-Linquistic      Cognitive-Linquistic Treatment   Treatment focused on Cognition;Patient/family/caregiver education    Skilled Treatment Pt completed HEP to complete meal with substitution of ingredients and watching TedTalks at 1.5x speed. No overt difficulty exhibited or reported for recall, attention, and problem solving for tasks. Pt did endorse some difficulty with maintaining attention in noisy environment. Pt typically works in quiet environment and utilizes headphones/ear buds when necessary to aid focus at work. SLP targeted multi-tasking and alternating/divided attention task this session. Background noise present. Pt was to alternate attention for sequencing tasks and divide attention between written and auditory tasks. Pt exhibited good  ability to alternate attention with rare min A required x2; however, increased difficulty with divided attention demonstrated as pt increasingly focused on alternating attention. One missed word problem demonstrated due to increased distraction that pt noted after completion. Increased focus endorsed to complete written task with only background noise present. SLP recommended alternating/divided attention for HEP.      Assessment / Recommendations / Plan   Plan Continue with current plan of care      Progression Toward Goals   Progression toward goals Progressing toward goals              SLP Education - 07/23/21 1705     Education Details functional application of alternating/divided attention tasks    Person(s) Educated Patient    Methods Explanation;Demonstration;Handout    Comprehension Verbalized understanding;Returned demonstration;Need further instruction              SLP Short Term Goals - 07/23/21 1615       SLP SHORT TERM GOAL #1   Title Pt will utilize 3 memory and attention compensations to reduce number of incomplete or forgotten tasks <5 per week with occasional min A over 2 sessions    Baseline 07-02-21, 07-23-21    Period --    Status Achieved      SLP SHORT TERM GOAL #2   Title Pt will demonstrate attention to detail and problem solving for mod complex therapeutic tasks with 80% accuracy given rare  min A over 2 sessions    Baseline 07-02-21, 07-23-21    Time --    Period --    Status Achieved      SLP SHORT TERM GOAL #3   Title Pt will verbally generate and carryover solutions to problems encountered in everyday living and work environment with occasional min A over 2 sessions    Baseline 07-02-21. 07-23-21    Time --    Period --    Status Achieved              SLP Long Term Goals - 07/23/21 1615       SLP LONG TERM GOAL #1   Title Pt will utilize 3 memory and attention compensations to reduce number of incomplete or forgotten tasks <2 per week  with rare min A over 2 sessions    Time 6    Period Weeks    Status On-going      SLP LONG TERM GOAL #2   Title Pt will demonstrate attention to detail and problem solving for mod complex therapeutic tasks with 95% accuracy given rare min A over 2 sessions    Time 6    Period Weeks    Status On-going      SLP LONG TERM GOAL #3   Title Pt will verbally generate and carryover solutions to problems encountered in everyday living and work environment with rare min A over 2 sessions    Time 6    Period Weeks    Status On-going              Plan - 07/23/21 1705     Clinical Impression Statement Kindall presents for OPST intervention secondary to change in cognition s/p CVA in April 2022. SLP targeted alternating and divided attention tasks due to recent episode of difficulty focusing. Pt demonstrated good alternating attention, but pt exhibited increased difficulty with divided attention. HEP added to target attention at home. SLP again recommended activities to pursue at home to utilize high level thinking skills. Given high level cognitive deficits demonstrated and reported, skilled ST is warranted to address cognitive linguistic skills impacting pt's ability to successfully return to work and school.    Speech Therapy Frequency 1x /week    Duration 8 weeks    Treatment/Interventions Compensatory strategies;Functional tasks;Cueing hierarchy;Compensatory techniques;Internal/external aids;SLP instruction and feedback;Cognitive reorganization;Multimodal communcation approach    Potential to Achieve Goals Good    SLP Home Exercise Plan provided    Consulted and Agree with Plan of Care Patient             Patient will benefit from skilled therapeutic intervention in order to improve the following deficits and impairments:   Cognitive communication deficit    Problem List Patient Active Problem List   Diagnosis Date Noted   Cerebellar infarct [facility]) 04/09/2021    Janann Colonel, MA CCC-SLP 07/23/2021, 5:16 PM  [facility] [address] [place], [place], [address] Phone: [phone]   Fax:  [phone]   Name: ANNORA GUDERIAN MRN: [id number] Date of Birth: 06/30/1991

## 2021-07-23 NOTE — Patient Instructions (Signed)
Stress management  Listening to audio/visual while doing math worksheets   Talking a friend on the phone while you complete the math problems

## 2021-07-29 ENCOUNTER — Ambulatory Visit: Payer: 59

## 2021-07-29 ENCOUNTER — Other Ambulatory Visit: Payer: Self-pay

## 2021-07-29 DIAGNOSIS — R42 Dizziness and giddiness: Secondary | ICD-10-CM | POA: Diagnosis not present

## 2021-07-29 DIAGNOSIS — R41841 Cognitive communication deficit: Secondary | ICD-10-CM

## 2021-07-30 ENCOUNTER — Telehealth: Payer: Self-pay | Admitting: Adult Health

## 2021-07-30 ENCOUNTER — Other Ambulatory Visit: Payer: Self-pay | Admitting: Adult Health

## 2021-07-30 DIAGNOSIS — I7774 Dissection of vertebral artery: Secondary | ICD-10-CM

## 2021-07-30 DIAGNOSIS — I639 Cerebral infarction, unspecified: Secondary | ICD-10-CM

## 2021-07-30 DIAGNOSIS — I725 Aneurysm of other precerebral arteries: Secondary | ICD-10-CM

## 2021-07-30 NOTE — Telephone Encounter (Signed)
Pt called, have not received a call to schedule the CT Scan. Following up on scheduling of CT Scan appt. Would like a call back.

## 2021-07-30 NOTE — Telephone Encounter (Signed)
CTA Kenard Gower  [id number], [id number] exp 08/01/21.  CTA Neck Berkley Harvey: [id number], [id number] exp 08/01/21  Patient is scheduled at GI for 07/31/21.

## 2021-07-30 NOTE — Telephone Encounter (Signed)
Contacted pt back, informed her that the order was sent over to [facility] and to give them a call to see about scheduling. Provided pt with Address: [address] [place], [place], [place] [address] Phone: [phone]

## 2021-07-30 NOTE — Telephone Encounter (Signed)
[employer] auth for CTA Head

## 2021-07-30 NOTE — Therapy (Signed)
[facility] [address] [place], [place], [address] Phone: [phone]   Fax:  [phone]  Speech Language Pathology Treatment  Patient Details  Name: Caroline Graham MRN: [id number] Date of Birth: 02/22/1991 Referring Provider (SLP): Ihor Austin, NP   Encounter Date: 07/29/2021   End of Session - 07/30/21 0813     Visit Number 6    Number of Visits 9    Date for SLP Re-Evaluation 08/13/21    Authorization Time Period 90 PT/ST/OT    SLP Start Time 1617    SLP Stop Time  1657    SLP Time Calculation (min) 40 min    Activity Tolerance Patient tolerated treatment well             History reviewed. No pertinent past medical history.  Past Surgical History:  Procedure Laterality Date   KNEE SURGERY     TONSILLECTOMY      There were no vitals filed for this visit.   Subjective Assessment - 07/29/21 1620     Subjective "I learned some new stress management techniques"    Currently in Pain? No/denies                   ADULT SLP TREATMENT - 07/29/21 1628       General Information   Behavior/Cognition Alert;Cooperative;Pleasant mood      Treatment Provided   Treatment provided Cognitive-Linquistic      Cognitive-Linquistic Treatment   Treatment focused on Cognition;Patient/family/caregiver education    Skilled Treatment Pt reports having completed HEP to research and recall stress management techniques for return to work and school, which was helpful. Pt also completed divided attention task to hold conversation while working on functional math worksheet, with some extended time required. Pt will plan to limit multitasking when possible when returning to work. SLP engaged patient in divided attention task, in which patient completed task with occasional re-direction required to attend to written task. SLP continues to recommend double checking work to ensure accuracy and introducing  various stimulation during daily tasks to target attention.      Assessment / Recommendations / Plan   Plan Continue with current plan of care      Progression Toward Goals   Progression toward goals Progressing toward goals              SLP Education - 07/30/21 [phone]     Education Details double check, functional divided attention activities    Person(s) Educated Patient    Methods Explanation;Demonstration;Handout    Comprehension Verbalized understanding;Returned demonstration;Need further instruction              SLP Short Term Goals - 07/23/21 1615       SLP SHORT TERM GOAL #1   Title Pt will utilize 3 memory and attention compensations to reduce number of incomplete or forgotten tasks <5 per week with occasional min A over 2 sessions    Baseline 07-02-21, 07-23-21    Period --    Status Achieved      SLP SHORT TERM GOAL #2   Title Pt will demonstrate attention to detail and problem solving for mod complex therapeutic tasks with 80% accuracy given rare min A over 2 sessions    Baseline 07-02-21, 07-23-21    Time --    Period --    Status Achieved      SLP SHORT TERM GOAL #3   Title Pt will verbally generate and carryover solutions to  problems encountered in everyday living and work environment with occasional min A over 2 sessions    Baseline 07-02-21. 07-23-21    Time --    Period --    Status Achieved              SLP Long Term Goals - 07/30/21 0815       SLP LONG TERM GOAL #1   Title Pt will utilize 3 memory and attention compensations to reduce number of incomplete or forgotten tasks <2 per week with rare min A over 2 sessions    Baseline 07-29-21    Time 4    Period Weeks    Status On-going      SLP LONG TERM GOAL #2   Title Pt will demonstrate attention to detail and problem solving for mod complex therapeutic tasks with 95% accuracy given rare min A over 2 sessions    Baseline 07-29-21    Time 4    Period Weeks    Status On-going      SLP LONG  TERM GOAL #3   Title Pt will verbally generate and carryover solutions to problems encountered in everyday living and work environment with rare min A over 2 sessions    Baseline 07-29-21    Time 4    Period Weeks    Status On-going              Plan - 07/30/21 0813     Clinical Impression Statement Caroline Graham presents for OPST intervention secondary to change in cognition s/p CVA in April 2022. SLP targeted alternating and divided attention tasks due to recent episode of difficulty focusing. Pt demonstrated good alternating attention, but pt exhibited some mild difficulty with divided attention. HEP added to target attention at home. SLP again recommended activities to pursue at home to utilize high level thinking skills. Given high level cognitive deficits demonstrated and reported, skilled ST is warranted to address cognitive linguistic skills impacting pt's ability to successfully return to work and school.    Speech Therapy Frequency 1x /week    Duration 8 weeks    Treatment/Interventions Compensatory strategies;Functional tasks;Cueing hierarchy;Compensatory techniques;Internal/external aids;SLP instruction and feedback;Cognitive reorganization;Multimodal communcation approach;Oral motor exercises    Potential to Achieve Goals Good    SLP Home Exercise Plan provided    Consulted and Agree with Plan of Care Patient             Patient will benefit from skilled therapeutic intervention in order to improve the following deficits and impairments:   Cognitive communication deficit    Problem List Patient Active Problem List   Diagnosis Date Noted   Cerebellar infarct [facility]) 04/09/2021    Janann Colonel, MA CCC-SLP 07/30/2021, 8:17 AM  [facility] [address] [place], [place], [address] Phone: [phone]   Fax:  [phone]   Name: Caroline Graham MRN: [id number] Date of Birth: Dec 05, 1991

## 2021-07-31 ENCOUNTER — Ambulatory Visit
Admission: RE | Admit: 2021-07-31 | Discharge: 2021-07-31 | Disposition: A | Discharge status: Home / Self Care | Payer: 59 | Source: Ambulatory Visit | Attending: Adult Health | Admitting: Adult Health

## 2021-07-31 DIAGNOSIS — I639 Cerebral infarction, unspecified: Secondary | ICD-10-CM

## 2021-07-31 DIAGNOSIS — I725 Aneurysm of other precerebral arteries: Secondary | ICD-10-CM

## 2021-07-31 DIAGNOSIS — I7774 Dissection of vertebral artery: Secondary | ICD-10-CM

## 2021-07-31 IMAGING — CT CT ANGIO HEAD
2 of 13 series · 4 of 33 positions shown · IV contrast (iopamidol)
Comparison: [DATE]

CLINICAL DATA: Vertebral artery dissection; follow-up prior
abnormal study

EXAM:
CT ANGIOGRAPHY HEAD AND NECK
TECHNIQUE: Multidetector CT imaging of the head and neck was performed using
the standard protocol during bolus administration of intravenous
contrast. Multiplanar CT image reconstructions and MIPs were
obtained to evaluate the vascular anatomy. Carotid stenosis
measurements (when applicable) are obtained utilizing NASCET
criteria, using the distal internal carotid diameter as the
denominator.
CONTRAST:  75mL [GJ] IOPAMIDOL ([GJ]) INJECTION 76%

[Series 11: brain 3.00 hr40 s3 sag without ibhc · sagittal · non-contrast · 0.33mm/px · 1 of 59 slices shown]
[im 30/59  soft-tissue]
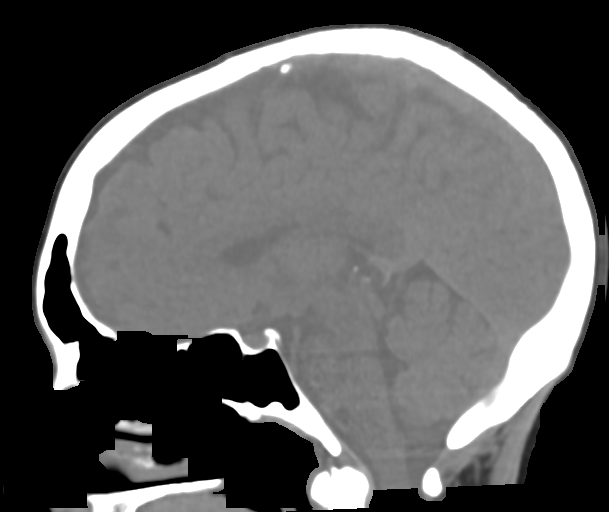

[Series 14: cta head & neck 1.00 hv48 s3 ax thin mips · axial · 0.39mm/px · z∈[-843,-473]mm · 3 of 371 slices shown]
[im 1/371  soft-tissue]
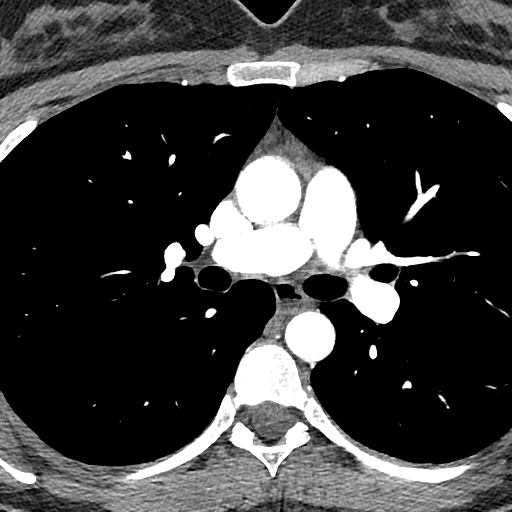
[im 186/371  bone]
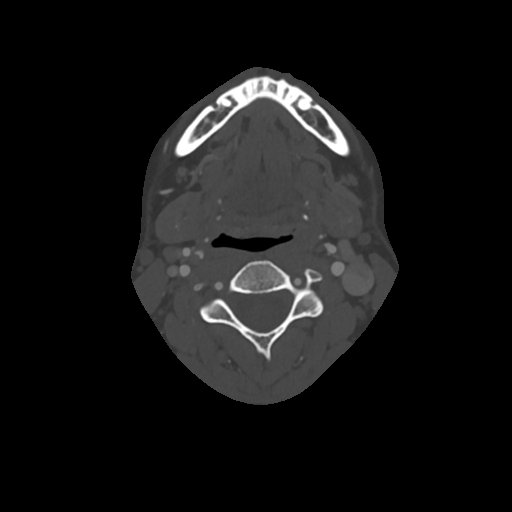
[im 371/371  soft-tissue]
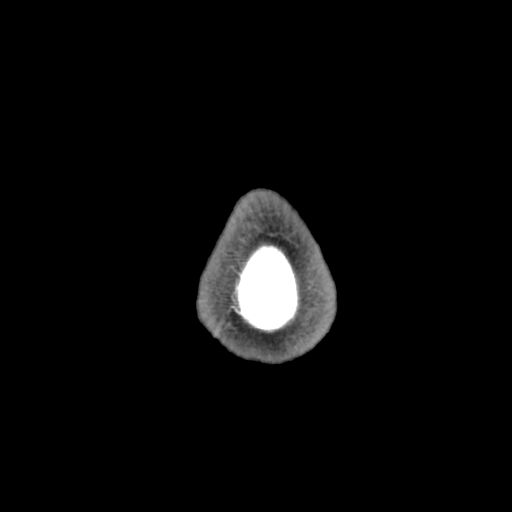

[4 of 33 positions shown; findings below may reference images not displayed]

FINDINGS: CT HEAD

Brain: There is no acute intracranial hemorrhage, mass effect, or
edema. No new loss of gray-white differentiation. Left cerebellar
infarct identified with resolution of associated edema. There is no
extra-axial fluid collection. Ventricles and sulci are within normal
limits in size and configuration.

Vascular: Better evaluated on CTA portion.

Skull: Calvarium is unremarkable.

Sinuses/Orbits: No acute finding.

Other: None.

Review of the MIP images confirms the above findings

CTA NECK

Aortic arch: Visualized thoracic aorta is unremarkable. Great vessel
origins are patent.

Right carotid system: Patent. No stenosis or evidence of dissection.

Left carotid system: Patent.  No stenosis or evidence of dissection.

Vertebral arteries: Patent. Right vertebral artery slightly
dominant. No stenosis. Distal caliber significantly improved
bilaterally. There is minor irregularity remaining on the left at
the C1-C2 level.

Skeleton: Unremarkable.

Other neck: Unremarkable.

Upper chest: Included upper lungs are clear.

Review of the MIP images confirms the above findings

CTA HEAD

Anterior circulation: Intracranial internal carotid arteries are
patent. Anterior and middle cerebral arteries are patent.

Posterior circulation: Intracranial vertebral arteries are patent
with significantly improved caliber. Basilar artery is patent
without apparent aneurysm. Major cerebellar artery origins are
patent. Bilateral posterior communicating arteries are present.
Posterior cerebral arteries are patent. Left P1 PCA is hypoplastic.

Venous sinuses: Patent as allowed by contrast bolus timing.

Review of the MIP images confirms the above findings
IMPRESSION: Essentially resolved previously identified irregularity and
narrowing of the vertebral arteries.

There is no basilar artery aneurysm.

Now chronic left cerebellar infarct.  No new findings.

## 2021-07-31 IMAGING — CT CT ANGIO NECK
1 series · 1 of 1 positions shown · IV contrast (iopamidol)
Comparison: [DATE]

CLINICAL DATA: Vertebral artery dissection; follow-up prior
abnormal study

EXAM:
CT ANGIOGRAPHY HEAD AND NECK
TECHNIQUE: Multidetector CT imaging of the head and neck was performed using
the standard protocol during bolus administration of intravenous
contrast. Multiplanar CT image reconstructions and MIPs were
obtained to evaluate the vascular anatomy. Carotid stenosis
measurements (when applicable) are obtained utilizing NASCET
criteria, using the distal internal carotid diameter as the
denominator.
CONTRAST:  75mL [GJ] IOPAMIDOL ([GJ]) INJECTION 76%

[Series 2: topogram 0.70 tr20 · sagittal · 1.00mm/px · 1 of 1 slices shown]
[im 1/1]
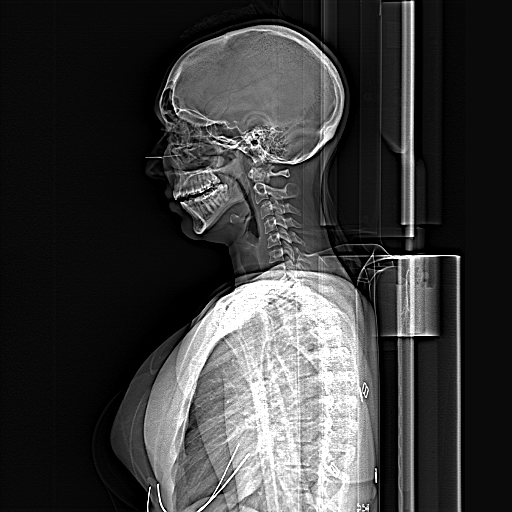

[1 of 1 positions shown; findings below may reference images not displayed]

FINDINGS: CT HEAD

Brain: There is no acute intracranial hemorrhage, mass effect, or
edema. No new loss of gray-white differentiation. Left cerebellar
infarct identified with resolution of associated edema. There is no
extra-axial fluid collection. Ventricles and sulci are within normal
limits in size and configuration.

Vascular: Better evaluated on CTA portion.

Skull: Calvarium is unremarkable.

Sinuses/Orbits: No acute finding.

Other: None.

Review of the MIP images confirms the above findings

CTA NECK

Aortic arch: Visualized thoracic aorta is unremarkable. Great vessel
origins are patent.

Right carotid system: Patent. No stenosis or evidence of dissection.

Left carotid system: Patent.  No stenosis or evidence of dissection.

Vertebral arteries: Patent. Right vertebral artery slightly
dominant. No stenosis. Distal caliber significantly improved
bilaterally. There is minor irregularity remaining on the left at
the C1-C2 level.

Skeleton: Unremarkable.

Other neck: Unremarkable.

Upper chest: Included upper lungs are clear.

Review of the MIP images confirms the above findings

CTA HEAD

Anterior circulation: Intracranial internal carotid arteries are
patent. Anterior and middle cerebral arteries are patent.

Posterior circulation: Intracranial vertebral arteries are patent
with significantly improved caliber. Basilar artery is patent
without apparent aneurysm. Major cerebellar artery origins are
patent. Bilateral posterior communicating arteries are present.
Posterior cerebral arteries are patent. Left P1 PCA is hypoplastic.

Venous sinuses: Patent as allowed by contrast bolus timing.

Review of the MIP images confirms the above findings
IMPRESSION: Essentially resolved previously identified irregularity and
narrowing of the vertebral arteries.

There is no basilar artery aneurysm.

Now chronic left cerebellar infarct.  No new findings.

## 2021-07-31 MED ORDER — IOPAMIDOL (ISOVUE-300) INJECTION 61%: 75.0000 mL | Freq: Once | INTRAVENOUS | Status: DC | PRN

## 2021-07-31 MED ORDER — IOPAMIDOL (ISOVUE-370) INJECTION 76%
75.0000 mL | Freq: Once | INTRAVENOUS | Status: AC | PRN
  Administered 2021-07-31: 75 mL via INTRAVENOUS

## 2021-08-05 ENCOUNTER — Ambulatory Visit: Payer: 59

## 2021-08-05 ENCOUNTER — Other Ambulatory Visit: Payer: Self-pay

## 2021-08-05 ENCOUNTER — Ambulatory Visit: Payer: 59 | Admitting: Physical Therapy

## 2021-08-05 ENCOUNTER — Encounter: Payer: Self-pay | Admitting: Physical Therapy

## 2021-08-05 DIAGNOSIS — R42 Dizziness and giddiness: Secondary | ICD-10-CM

## 2021-08-05 DIAGNOSIS — R278 Other lack of coordination: Secondary | ICD-10-CM

## 2021-08-05 DIAGNOSIS — R2681 Unsteadiness on feet: Secondary | ICD-10-CM

## 2021-08-05 DIAGNOSIS — R2689 Other abnormalities of gait and mobility: Secondary | ICD-10-CM

## 2021-08-05 DIAGNOSIS — R41841 Cognitive communication deficit: Secondary | ICD-10-CM

## 2021-08-05 NOTE — Patient Instructions (Signed)
Access Code: [email or web address] URL: [email or web address] Date: 08/05/2021 Prepared by: Bufford Lope  Exercises Standing VOR Cancellation - 2 x daily - 7 x weekly - 2 sets - 60 seconds hold Standing Gaze Stabilization with Head Rotation and Horizontal Arm Movement - 2 x daily - 7 x weekly - 1 sets - 2 reps - 60 seconds hold Bird Dog - 2 x daily - 7 x weekly - 1 sets - 10 reps Wide Tandem Stance on Foam Pad with Eyes Closed - 1 x daily - 7 x weekly - 2 sets - 10 reps

## 2021-08-05 NOTE — Therapy (Signed)
[facility] [address] [place], [place], [address] Phone: [phone]   Fax:  [phone]  Physical Therapy Treatment  Patient Details  Name: Caroline Graham MRN: [id number] Date of Birth: 08/11/1991 Referring Provider (PT): Ihor Austin, NP)   Encounter Date: 08/05/2021   PT End of Session - 08/05/21 1622     Visit Number 17    Number of Visits 21    Date for PT Re-Evaluation 08/21/21    Authorization Type [employer]90 combined visits PT, OT, speech    PT Start Time 1530    PT Stop Time 1615    PT Time Calculation (min) 45 min    Activity Tolerance Patient tolerated treatment well    Behavior During Therapy [facility] [place] for tasks assessed/performed             History reviewed. No pertinent past medical history.  Past Surgical History:  Procedure Laterality Date   KNEE SURGERY     TONSILLECTOMY      There were no vitals filed for this visit.   Subjective Assessment - 08/05/21 1535     Subjective Pt reports symptoms in therapy only increase to about 2/10; when not in therapy symptoms can increase to about 5-6/10.  This weekend she went to a retirement party - went walking on trail in the dark, uneven ground.  Had a hard time putting shoe on in standing afterwards.  No issues since then.    Pertinent History Left vertebral Artery Dissection    Patient Stated Goals Pt's goals for PT are to regain coordination and balance; build more confidence.    Currently in Pain? No/denies    Pain Onset Today              Vestibular Treatment/Exercise - 08/05/21 1632       Vestibular Treatment/Exercise   Vestibular Treatment Provided Gaze    Gaze Exercises X2 Viewing Horizontal;Eye/Head Exercise Horizontal      X2 Viewing Horizontal   Foot Position solid surface, busy background and mirrors on both sides for increased visual stimulation    Reps 3     Comments 60 seconds; increased speed of movement and  decreased ROM; 3/10 dizziness      Eye/Head Exercise Horizontal   Foot Position Solid surface > compliant surface with busy background and mirrors on each side for increased visual stimulation    Reps 3    Comments VOR cancellation, 60 seconds, progressed to faster speeds                Balance Exercises - 08/05/21 1634       Balance Exercises: Standing   Standing Eyes Closed Narrow base of support (BOS);Foam/compliant surface;Head turns;5 reps;Limitations    Standing Eyes Closed Limitations feet together > feet staggered R then L foot forwards.  5 reps head turns and head nods    Rebounder Single leg;Double leg;Limitations    Rebounder Limitations Bouncing on rebounder for 2 minutes double LE > single LE alternating between R and L > double leg with head turns and head nods x 5 reps each.  Also performed bouncing with hand/eye coordination task of catching and tossing ball back to PT x 2 minutes.  Progressed by adding multiple tasks of bouncing, ball catch/toss and cognitive task of food naming based on alphabet.  No dizziness and no LOB but pt intermittently had to stop bouncing or stop ball toss in order to focus on cognitive activity.  PT Education - 08/05/21 1632     Education Details updated HEP    Person(s) Educated Patient    Methods Explanation;Demonstration;Handout    Comprehension Verbalized understanding;Returned demonstration              PT Short Term Goals - 08/05/21 1623       PT SHORT TERM GOAL #1   Title The patient will continue to progress with HEP.    Time 3    Period Weeks    Status Achieved    Target Date 07/31/21               PT Long Term Goals - 07/10/21 1711       PT LONG TERM GOAL #1   Title The patient will be indep with progression of HEP.    Time 6    Period Weeks    Status Revised    Target Date 08/21/21      PT LONG TERM GOAL #2   Title The patient will report tolerance to busy store environments x  20 minutes for return to community tasks.    Baseline Symptoms within 8 minutes    Time 6    Period Weeks    Status New    Target Date 08/21/21      PT LONG TERM GOAL #3   Title Pt will perform at least 20 minutes of continuous exercise/activity/gait tasks with no reported increase in vestibular symptoms.    Baseline varies depending on sleep, environment    Time 6    Period Weeks    Status Revised    Target Date 08/21/21                   Plan - 08/05/21 1623     Clinical Impression Statement Pt continues to present with impaired balance and symptoms of dizziness when in low light conditions, uneven surfaces and with increased visual flow.  Continued to progress HEP by increasing visual stimulation and increased speed of movement; pt reported increased symptoms as speed increased.  Added balance with eyes closed to HEP.  Incorporated dual tasking to balance and vestibular stimulation without any symptoms of dizziness but slightly increased difficulty alternating attention.    Personal Factors and Comorbidities Comorbidity 1    Comorbidities hx of knee surgery (L)    Examination-Activity Limitations Locomotion Level;Transfers;Squat;Carry    Examination-Participation Restrictions Community Activity;Driving    Rehab Potential Good    PT Frequency 1x / week    PT Duration 6 weeks    PT Treatment/Interventions ADLs/Self Care Home Management;Gait training;Stair training;Functional mobility training;Therapeutic activities;Therapeutic exercise;Balance training;Neuromuscular re-education;Patient/family education;Vestibular    PT Next Visit Plan Anything to challenge balance on compliant surfaces, vision removed, SLS, head turns.  VOR x 2 surrounded by mirrors for increased visual stimulation.  Start checking LTG and plan for D/C soon.    Consulted and Agree with Plan of Care Patient             Patient will benefit from skilled therapeutic intervention in order to improve the  following deficits and impairments:  Abnormal gait, Decreased mobility, Decreased strength, Dizziness, Difficulty walking, Decreased activity tolerance  Visit Diagnosis: Unsteadiness on feet  Dizziness and giddiness  Other abnormalities of gait and mobility  Other lack of coordination     Problem List Patient Active Problem List   Diagnosis Date Noted   Cerebellar infarct (HCC) 04/09/2021    Dierdre Highman, PT, DPT 08/05/21    4:37  PM    [facility] [address] [place], [place], [address] Phone: [phone]   Fax:  [phone]  Name: ZAINAB CRUMRINE MRN: [id number] Date of Birth: 1991-03-30

## 2021-08-05 NOTE — Therapy (Signed)
[facility] [address] [place], [place], [address] Phone: [phone]   Fax:  [phone]  Speech Language Pathology Treatment  Patient Details  Name: Caroline Graham MRN: [id number] Date of Birth: [birth year] Referring Provider (SLP): Ihor Austin, NP   Encounter Date: 08/05/2021   End of Session - 08/05/21 1615     Visit Number 7    Number of Visits 9    Date for SLP Re-Evaluation 08/13/21    Authorization Time Period 90 PT/ST/OT    SLP Start Time 1615    SLP Stop Time  1700    SLP Time Calculation (min) 45 min    Activity Tolerance Patient tolerated treatment well             History reviewed. No pertinent past medical history.  Past Surgical History:  Procedure Laterality Date   KNEE SURGERY     TONSILLECTOMY      There were no vitals filed for this visit.   Subjective Assessment - 08/05/21 1636     Subjective "I sometimes tell people the same thing"    Currently in Pain? No/denies                   ADULT SLP TREATMENT - 08/05/21 1615       General Information   Behavior/Cognition Alert;Cooperative;Pleasant mood      Treatment Provided   Treatment provided Cognitive-Linquistic      Cognitive-Linquistic Treatment   Treatment focused on Cognition;Patient/family/caregiver education    Skilled Treatment Pt reports some improvements in divided attention, with success reported for completing reading in distracting environments. Pt endorsed some reduced recall with pt providing examples of repeating same infromation and not recalling an interpersonal connection. SLP targeted divided attention tasks, in which pt able to complete with mild additional time. Pt able to recall information from [employer] with good accuracy while simultaneously working on written task. SLP targeted auditory recall of 3-step instructions, in which pt able to recall with 63% immediately, which improved to 100%  accuracy with secondary repetition. Pt endorses second guessing herself secondary to multiple components. SLP provided functional application to workplace, including limiting distractions, focusing on one task a time, and using repetition to ensure accuracy.      Assessment / Recommendations / Plan   Plan Continue with current plan of care      Progression Toward Goals   Progression toward goals Progressing toward goals              SLP Education - 08/05/21 1642     Education Details double check, functional divided attention tasks    Person(s) Educated Patient    Methods Explanation;Demonstration    Comprehension Verbalized understanding;Returned demonstration;Need further instruction              SLP Short Term Goals - 07/23/21 1615       SLP SHORT TERM GOAL #1   Title Pt will utilize 3 memory and attention compensations to reduce number of incomplete or forgotten tasks <5 per week with occasional min A over 2 sessions    Baseline 07-02-21, 07-23-21    Period --    Status Achieved      SLP SHORT TERM GOAL #2   Title Pt will demonstrate attention to detail and problem solving for mod complex therapeutic tasks with 80% accuracy given rare min A over 2 sessions    Baseline 07-02-21, 07-23-21    Time --    Period --  Status Achieved      SLP SHORT TERM GOAL #3   Title Pt will verbally generate and carryover solutions to problems encountered in everyday living and work environment with occasional min A over 2 sessions    Baseline 07-02-21. 07-23-21    Time --    Period --    Status Achieved              SLP Long Term Goals - 08/05/21 1617       SLP LONG TERM GOAL #1   Title Pt will utilize 3 memory and attention compensations to reduce number of incomplete or forgotten tasks <2 per week with rare min A over 2 sessions    Baseline 07-29-21    Time 3    Period Weeks    Status On-going      SLP LONG TERM GOAL #2   Title Pt will demonstrate attention to detail and  problem solving for mod complex therapeutic tasks with 95% accuracy given rare min A over 2 sessions    Baseline 07-29-21, 08-05-21    Time --    Period --    Status Achieved      SLP LONG TERM GOAL #3   Title Pt will verbally generate and carryover solutions to problems encountered in everyday living and work environment with rare min A over 2 sessions    Baseline 07-29-21    Time 3    Period Weeks    Status On-going              Plan - 08/05/21 1640     Clinical Impression Statement Chanda presents for OPST intervention secondary to change in cognition s/p CVA in April 2022. SLP targeted auditory recall tasks and divided attention tasks, in which pt benefited from occasional repetition to increase accuracy. SLP provided education re: functional application to workplace. Given high level cognitive deficits demonstrated and reported, skilled ST is warranted to address cognitive linguistic skills impacting pt's ability to successfully return to work and school.    Speech Therapy Frequency 1x /week    Duration 8 weeks    Treatment/Interventions Compensatory strategies;Functional tasks;Cueing hierarchy;Compensatory techniques;Internal/external aids;SLP instruction and feedback;Cognitive reorganization;Multimodal communcation approach;Oral motor exercises    Potential to Achieve Goals Good    SLP Home Exercise Plan provided    Consulted and Agree with Plan of Care Patient             Patient will benefit from skilled therapeutic intervention in order to improve the following deficits and impairments:   Cognitive communication deficit    Problem List Patient Active Problem List   Diagnosis Date Noted   Cerebellar infarct (HCC) 04/09/2021    Janann Colonel, MA CCC-SLP 08/05/2021, 8:21 PM  [facility] [address] [place], [place], [address] Phone: [phone]   Fax:  [phone]   Name: SHANEISHA BURKEL MRN: [id number] Date of Birth: Mar 04, 1991

## 2021-08-12 ENCOUNTER — Ambulatory Visit: Payer: 59

## 2021-08-12 ENCOUNTER — Other Ambulatory Visit: Payer: Self-pay

## 2021-08-12 DIAGNOSIS — R42 Dizziness and giddiness: Secondary | ICD-10-CM

## 2021-08-12 DIAGNOSIS — R41841 Cognitive communication deficit: Secondary | ICD-10-CM

## 2021-08-12 DIAGNOSIS — R2681 Unsteadiness on feet: Secondary | ICD-10-CM

## 2021-08-12 NOTE — Therapy (Signed)
[facility] [address] [place], [place], [address] Phone: [phone]   Fax:  [phone]  Physical Therapy Treatment  Patient Details  Name: Caroline Graham MRN: [id number] Date of Birth: Mar 28, 1991 Referring Provider (PT): Ihor Austin, NP)   Encounter Date: 08/12/2021   PT End of Session - 08/12/21 1701     Visit Number 18    Number of Visits 21    Date for PT Re-Evaluation 08/21/21    Authorization Type [employer]-90 combined visits PT, OT, speech    PT Start Time 1701    PT Stop Time 1741    PT Time Calculation (min) 40 min    Activity Tolerance Patient tolerated treatment well    Behavior During Therapy [facility] for tasks assessed/performed             History reviewed. No pertinent past medical history.  Past Surgical History:  Procedure Laterality Date   KNEE SURGERY     TONSILLECTOMY      There were no vitals filed for this visit.   Subjective Assessment - 08/12/21 1702     Subjective Patient reports no new changes. Still most symptomatic with going into stores. Reports she went to three stores, [employer] last and felt scatter brained by the end.    Pertinent History Left vertebral Artery Dissection    Patient Stated Goals Pt's goals for PT are to regain coordination and balance; build more confidence.    Currently in Pain? No/denies              [facility] Adult PT Treatment/Exercise - 08/12/21 0001       Ambulation/Gait   Ambulation/Gait Yes    Ambulation/Gait Assistance 5: Supervision    Gait Comments gait on treadmill: completed ambulation forwards at 1.8 mph with R/L head turns to playing cards x 15 reps. then at 1.5 mph working on transition from sidestepping to R/L and backwards walking. No dizziness.      Neuro Re-ed    Neuro Re-ed Details  Completed ambulation with PT following tossing ball R/L side and patient turning to toss/catch small ball. Completed x 230 ft with mild (3/10)  dizziness with completion.             Vestibular Treatment/Exercise - 08/12/21 0001       Vestibular Treatment/Exercise   Vestibular Treatment Provided Gaze    Gaze Exercises X2 Viewing Horizontal;X2 Viewing Vertical;Eye/Head Exercise Horizontal      Standing Horizontal Head Turns   Number of Reps  2   lines   Symptom Description  x 7 numbers: standing on foam with two mirrors anterior/lateral compelted horizontal head turn to dual task with card with patient stating number, and then with next card added in addition. Increased cog challenge noted, no dizziness.      X1 Viewing Horizontal   Foot Position with ambulation forwards on patterned background with addition of stroop task    Reps 3    Comments x 30 seconds      X2 Viewing Horizontal   Foot Position solid surface, busy background and mirrors on both sides for increased visual stimulation    Reps 2     Comments 60 seconds; 3/10 dizziness      X2 Viewing Vertical   Foot Position solid surface, busy background and mirrors on both sides for increased visual stimulation    Reps 2    Comments x 60 seconds; no dizziness      Eye/Head  Exercise Horizontal   Foot Position solid surface patterned background with stoop task    Reps 2    Comments VOR cancellation x 5 lines, progressing speeds                PT Short Term Goals - 08/05/21 1623       PT SHORT TERM GOAL #1   Title The patient will continue to progress with HEP.    Time 3    Period Weeks    Status Achieved    Target Date 07/31/21               PT Long Term Goals - 07/10/21 1711       PT LONG TERM GOAL #1   Title The patient will be indep with progression of HEP.    Time 6    Period Weeks    Status Revised    Target Date 08/21/21      PT LONG TERM GOAL #2   Title The patient will report tolerance to busy store environments x 20 minutes for return to community tasks.    Baseline Symptoms within 8 minutes    Time 6    Period Weeks     Status New    Target Date 08/21/21      PT LONG TERM GOAL #3   Title Pt will perform at least 20 minutes of continuous exercise/activity/gait tasks with no reported increase in vestibular symptoms.    Baseline varies depending on sleep, environment    Time 6    Period Weeks    Status Revised    Target Date 08/21/21                   Plan - 08/12/21 1748     Clinical Impression Statement Continued progression of standing balance and visual stimulation activites with VOR x 1 and x 2. Patient tolerating well with mild dizziness. Added in stroop task and cog task of addition with head utrns on foam with increased challenge. Will continue to progress toward all LTGs.    Personal Factors and Comorbidities Comorbidity 1    Comorbidities hx of knee surgery (L)    Examination-Activity Limitations Locomotion Level;Transfers;Squat;Carry    Examination-Participation Restrictions Community Activity;Driving    Rehab Potential Good    PT Frequency 1x / week    PT Duration 6 weeks    PT Treatment/Interventions ADLs/Self Care Home Management;Gait training;Stair training;Functional mobility training;Therapeutic activities;Therapeutic exercise;Balance training;Neuromuscular re-education;Patient/family education;Vestibular    PT Next Visit Plan Anything to challenge balance on compliant surfaces, vision removed, SLS, head turns.  VOR x 2 surrounded by mirrors for increased visual stimulation.  Start checking LTG and plan for D/C soon.    Consulted and Agree with Plan of Care Patient             Patient will benefit from skilled therapeutic intervention in order to improve the following deficits and impairments:  Abnormal gait, Decreased mobility, Decreased strength, Dizziness, Difficulty walking, Decreased activity tolerance  Visit Diagnosis: Unsteadiness on feet  Dizziness and giddiness     Problem List Patient Active Problem List   Diagnosis Date Noted   Cerebellar infarct (HCC)  04/09/2021    Tempie Donning, PT, DPT 08/12/2021, 5:50 PM  [facility] [address] [place], [place], [address] Phone: [phone]   Fax:  [phone]  Name: Caroline Graham MRN: [id number] Date of Birth: December 02, 1991

## 2021-08-12 NOTE — Therapy (Signed)
Caroline Graham [address] [place], [place], [address] Phone: [phone]   Fax:  [phone]  Speech Language Pathology Treatment/Discharge Summary  Patient Details  Name: Caroline Graham MRN: [id number] Date of Birth: 12-05-1991 Referring Provider (SLP): Frann Rider, NP   Encounter Date: 08/12/2021   End of Session - 08/12/21 1744     Visit Number 8    Number of Visits 9    Date for SLP Re-Evaluation 08/13/21    Authorization Time Period 90 PT/ST/OT    SLP Start Time 1742    SLP Stop Time  6270    SLP Time Calculation (min) 30 min    Activity Tolerance Patient tolerated treatment well             History reviewed. No pertinent past medical history.  Past Surgical History:  Procedure Laterality Date   KNEE SURGERY     TONSILLECTOMY      There were no vitals filed for this visit.   Subjective Assessment - 08/12/21 1808     Subjective "all good"    Currently in Pain? No/denies              SPEECH THERAPY DISCHARGE SUMMARY  Visits from Start of Care: 8  Current functional level related to goals / functional outcomes: Caroline Graham presents with improvements in cognitive linguistic skills as pt has met all ST goals. SLP educated and trained compensations and cognitive tasks targeting recall, attention, and problem solving. Pt exhibited improvements in error awareness and divided attention. Pt plans to continue Constant Therapy s/p ST discharge. Pt verbalized understanding and agreement with ST discharge.    Remaining deficits: NA   Education / Equipment: Cognitive linguistic tasks, functional application, and compensatory strategies  Patient agrees to discharge. Patient goals were met. Patient is being discharged due to meeting the stated rehab goals..        ADULT SLP TREATMENT - 08/12/21 1743       General Information   Behavior/Cognition Alert;Cooperative;Pleasant mood       Treatment Provided   Treatment provided Cognitive-Linquistic      Cognitive-Linquistic Treatment   Treatment focused on Cognition;Patient/family/caregiver education    Skilled Treatment Pt reports improvements in cognition and is pleased with current progress. Pt denies any further issues with attention, memory, and problem solving. SLP re-educated patient on previous/current recommendations to continue to optimize cognitive functioning. Pt will resume Constant Therapy following ST discharge.      Assessment / Recommendations / Plan   Plan Discharge SLP treatment due to (comment)   pt pleased with current progress     Progression Toward Goals   Progression toward goals Goals met, education completed, patient discharged from [facility] - 08/12/21 1746     Education Details discharge summary    Person(s) Educated Patient    Methods Explanation;Demonstration    Comprehension Verbalized understanding;Returned demonstration;Need further instruction              SLP Short Term Goals - 07/23/21 1615       SLP SHORT TERM GOAL #1   Title Pt will utilize 3 memory and attention compensations to reduce number of incomplete or forgotten tasks <5 per week with occasional min A over 2 sessions    Baseline 07-02-21, 07-23-21    Period --    Status Achieved      SLP SHORT TERM GOAL #2  Title Pt will demonstrate attention to detail and problem solving for mod complex therapeutic tasks with 80% accuracy given rare min A over 2 sessions    Baseline 07-02-21, 07-23-21    Time --    Period --    Status Achieved      SLP SHORT TERM GOAL #3   Title Pt will verbally generate and carryover solutions to problems encountered in everyday living and work environment with occasional min A over 2 sessions    Baseline 07-02-21. 07-23-21    Time --    Period --    Status Achieved              SLP Long Term Goals - 08/12/21 1745       SLP LONG TERM GOAL #1   Title Pt will  utilize 3 memory and attention compensations to reduce number of incomplete or forgotten tasks <2 per week with rare min A over 2 sessions    Baseline 07-29-21, 08-12-21    Status Achieved      SLP LONG TERM GOAL #2   Title Pt will demonstrate attention to detail and problem solving for mod complex therapeutic tasks with 95% accuracy given rare min A over 2 sessions    Baseline 07-29-21, 08-05-21    Status Achieved      SLP LONG TERM GOAL #3   Title Pt will verbally generate and carryover solutions to problems encountered in everyday living and work environment with rare min A over 2 sessions    Baseline 07-29-21, 08-12-21    Status Achieved              Plan - 08/12/21 1805     Clinical Impression Statement Caroline Graham presents for OPST intervention secondary to change in cognition s/p CVA in April 2022. Pt reports improvements in cognitive linguistic skills. Pt is pleased with current progress and requested ST discharge on last scheduled visit. SLP reviewed previous/current recommendations and discharge summary with patient. Pt verbalized understanding and agreement. Pt aware of need for additional script to return to ST services.    Treatment/Interventions Compensatory strategies;Functional tasks;Cueing hierarchy;Compensatory techniques;Internal/external aids;SLP instruction and feedback;Cognitive reorganization;Multimodal communcation approach;Oral motor exercises    Potential to Achieve Goals Good    SLP Home Exercise Plan provided    Consulted and Agree with Plan of Care Patient             Patient will benefit from skilled therapeutic intervention in order to improve the following deficits and impairments:   Cognitive communication deficit    Problem List Patient Active Problem List   Diagnosis Date Noted   Cerebellar infarct [facility]) 04/09/2021    [name], MA CCC-SLP 08/12/2021, 6:19 PM  [facility] [address] [place], [place], [address] Phone: [phone]   Fax:  [phone]   Name: Caroline Graham MRN: [id number] Date of Birth: [birth year]

## 2021-08-20 ENCOUNTER — Other Ambulatory Visit: Payer: Self-pay

## 2021-08-20 ENCOUNTER — Ambulatory Visit (INDEPENDENT_AMBULATORY_CARE_PROVIDER_SITE_OTHER): Payer: 59 | Admitting: Adult Health

## 2021-08-20 ENCOUNTER — Ambulatory Visit: Payer: 59

## 2021-08-20 ENCOUNTER — Encounter: Payer: Self-pay | Admitting: Adult Health

## 2021-08-20 VITALS — BP 119/75 | HR 81 | Ht 67.0 in | Wt 141.0 lb

## 2021-08-20 DIAGNOSIS — I639 Cerebral infarction, unspecified: Secondary | ICD-10-CM | POA: Diagnosis not present

## 2021-08-20 DIAGNOSIS — I7774 Dissection of vertebral artery: Secondary | ICD-10-CM | POA: Diagnosis not present

## 2021-08-20 DIAGNOSIS — R2689 Other abnormalities of gait and mobility: Secondary | ICD-10-CM

## 2021-08-20 DIAGNOSIS — R42 Dizziness and giddiness: Secondary | ICD-10-CM

## 2021-08-20 DIAGNOSIS — Z8269 Family history of other diseases of the musculoskeletal system and connective tissue: Secondary | ICD-10-CM | POA: Diagnosis not present

## 2021-08-20 DIAGNOSIS — R2681 Unsteadiness on feet: Secondary | ICD-10-CM

## 2021-08-20 MED ORDER — NURTEC 75 MG PO TBDP: 75.0000 mg | ORAL_TABLET | Freq: Every day | ORAL | 0 refills | Status: DC | PRN

## 2021-08-20 NOTE — Patient Instructions (Signed)
Access Code: [email or web address] URL: [email or web address] Date: 08/20/2021 Prepared by: Jethro Bastos  Exercises Standing VOR Cancellation - 2 x daily - 3-4 x weekly - 2 sets - 60 seconds hold Standing Gaze Stabilization with Head Rotation and Horizontal Arm Movement - 2 x daily - 3-4 x weekly - 1 sets - 2 reps - 60 seconds hold Walking Gaze Stabilization Head Rotation - 1 x daily - 3-4 x weekly - 1 sets - 3-4 reps Bird Dog - 2 x daily - 3-4 x weekly - 1 sets - 10 reps Tandem Stance on Foam Pad with Eyes Closed - 1 x daily - 3-4 x weekly - 1 sets - 3 reps - 10-15 seconds hold Tandem Stance with Head Rotation on Foam Pad - 1 x daily - 3-4 x weekly - 2 sets - 10 reps

## 2021-08-20 NOTE — Therapy (Signed)
[facility] [address] [place], [place], [address] Phone: [phone]   Fax:  [phone]  Physical Therapy Treatment/Discharge Summary  Patient Details  Name: Caroline Graham MRN: [id number] Date of Birth: 10-09-1991 Referring Provider (PT): Frann Rider, NP)  PHYSICAL THERAPY DISCHARGE SUMMARY  Visits from Start of Care: 19  Current functional level related to goals / functional outcomes: See Clinical Impression Statement   Remaining deficits: Mild HA   Education / Equipment: HEP provided   Patient agrees to discharge. Patient goals were met. Patient is being discharged due to meeting the stated rehab goals.   Encounter Date: 08/20/2021   PT End of Session - 08/20/21 1404     Visit Number 19    Number of Visits 21    Date for PT Re-Evaluation 08/21/21    Authorization Type [employer]90 combined visits PT, OT, speech    PT Start Time 1402    PT Stop Time 1443    PT Time Calculation (min) 41 min    Activity Tolerance Patient tolerated treatment well    Behavior During Therapy [facility] for tasks assessed/performed             History reviewed. No pertinent past medical history.  Past Surgical History:  Procedure Laterality Date   KNEE SURGERY     TONSILLECTOMY      There were no vitals filed for this visit.   Subjective Assessment - 08/20/21 1407     Subjective Patient reports increase in HA, but believes it is due to altered sleep pattern with puppy. No other new changes/complaints.    Pertinent History Left vertebral Artery Dissection    Patient Stated Goals Pt's goals for PT are to regain coordination and balance; build more confidence.    Currently in Pain? No/denies                [facility] PT Assessment - 08/20/21 0001       Assessment   Medical Diagnosis cerebellar infarction-L inferioro    Referring Provider (PT) Frann Rider, NP)      Observation/Other Assessments   Focus  on Therapeutic Outcomes (FOTO)  Stroke LE: 98% (Intake: 62%, Expected: 77%)               [facility] Adult PT Treatment/Exercise - 08/20/21 0001       Ambulation/Gait   Ambulation/Gait Yes    Ambulation/Gait Assistance 5: Supervision    Assistive device None    Gait Pattern Within Functional Limits    Ambulation Surface Level;Indoor      Therapeutic Activites    Other Therapeutic Activities With "x' stationary on patterned backgroudn, compelted horizontal gaze x 1 with forward and backward ambulation 25' x 2 reps. Then completed vertical gaze x 1 with forward and backward ambulation 25' x 1 reps. Added to HEP.             Vestibular Treatment/Exercise - 08/20/21 0001       Vestibular Treatment/Exercise   Gaze Exercises X2 Viewing Horizontal      X2 Viewing Vertical   Foot Position solid surface, busy background    Reps 1    Comments x 60 seconds, no dizziness            Added new additions to HEP today (Bolded):  Access Code: [email or web address] URL: [email or web address] Date: 08/20/2021 Prepared by: Baldomero Lamy   Exercises Standing VOR Cancellation - 2 x daily - 3-4 x weekly - 2 sets - 60 seconds  hold Standing Gaze Stabilization with Head Rotation and Horizontal Arm Movement - 2 x daily - 3-4 x weekly - 1 sets - 2 reps - 60 seconds hold Walking Gaze Stabilization Head Rotation - 1 x daily - 3-4 x weekly - 1 sets - 3-4 reps Bird Dog - 2 x daily - 3-4 x weekly - 1 sets - 10 reps Tandem Stance on Foam Pad with Eyes Closed - 1 x daily - 3-4 x weekly - 1 sets - 3 reps - 10-15 seconds hold Tandem Stance with Head Rotation on Foam Pad - 1 x daily - 3-4 x weekly - 2 sets - 10 reps      PT Education - 08/20/21 1452     Education Details progress toward Goals; Updated HEP    Person(s) Educated Patient    Methods Explanation;Demonstration;Handout    Comprehension Verbalized understanding;Returned demonstration              PT Short Term Goals - 08/05/21  1623       PT SHORT TERM GOAL #1   Title The patient will continue to progress with HEP.    Time 3    Period Weeks    Status Achieved    Target Date 07/31/21               PT Long Term Goals - 08/20/21 1414       PT LONG TERM GOAL #1   Title The patient will be indep with progression of HEP.    Baseline Independent with HEP    Time 6    Period Weeks    Status Achieved      PT LONG TERM GOAL #2   Title The patient will report tolerance to busy store environments x 20 minutes for return to community tasks.    Baseline Symptoms within 8 minutes; symptoms >/= 30 minutes.    Time 6    Period Weeks    Status Achieved      PT LONG TERM GOAL #3   Title Pt will perform at least 20 minutes of continuous exercise/activity/gait tasks with no reported increase in vestibular symptoms.    Baseline varies depending on sleep, environment; reports she can walk >/= 30 minutes with no issues but is dependent upon environment.    Time 6    Period Weeks    Status Achieved                   Plan - 08/20/21 1454     Clinical Impression Statement Completed assesment of patient's progress toward LTGs. Patient able to meet all LTG. Demonstrating improved tolerance for physical activitity, and improved tolerance in crowded environment (grocery store/shopping) with able to tolerate >/= 30 minutes. Reviewed and progressed HEP to patient's tolerance. Patient demonstrating readiness for d/c from PT services at this time and patient agreeable at this time.    Personal Factors and Comorbidities Comorbidity 1    Comorbidities hx of knee surgery (L)    Examination-Activity Limitations Locomotion Level;Transfers;Squat;Carry    Examination-Participation Restrictions Community Activity;Driving    Rehab Potential Good    PT Frequency 1x / week    PT Duration 6 weeks    PT Treatment/Interventions ADLs/Self Care Home Management;Gait training;Stair training;Functional mobility training;Therapeutic  activities;Therapeutic exercise;Balance training;Neuromuscular re-education;Patient/family education;Vestibular    Consulted and Agree with Plan of Care Patient             Patient will benefit from skilled therapeutic intervention in order to  improve the following deficits and impairments:  Abnormal gait, Decreased mobility, Decreased strength, Dizziness, Difficulty walking, Decreased activity tolerance  Visit Diagnosis: Unsteadiness on feet  Dizziness and giddiness  Other abnormalities of gait and mobility     Problem List Patient Active Problem List   Diagnosis Date Noted   Cerebellar infarct ([place]) 04/09/2021    Jones Bales, PT, DPT 08/20/2021, 3:04 PM  [facility] [address] [place], [place], [address] Phone: [phone]   Fax:  [phone]  Name: Caroline Graham MRN: [id number] Date of Birth: Apr 25, 1991

## 2021-08-20 NOTE — Patient Instructions (Signed)
Check labs today to rule out connective tissue disorder  Continue aspirin 81 mg daily for secondary stroke prevention  Continue topiramate 50 mg nightly  Try Nurtec for acute migraine relief -if beneficial, please let me know     Followup in the future with me in 4 months or call earlier if needed       Thank you for coming to see [place] at [facility]. I hope we have been able to provide you high quality care today.  You may receive a patient satisfaction survey over the next few weeks. We would appreciate your feedback and comments so that we may continue to improve ourselves and the health of our patients.

## 2021-08-20 NOTE — Progress Notes (Signed)
[facility] [address] [place]. [place] [address] [phone]       STROKE FOLLOW UP NOTE  Ms. Sabrine Patchen McClung-Hill Date of Birth:  1991/06/17 Medical Record Number:  [id number]   Reason for Referral: stroke follow up    SUBJECTIVE:   CHIEF COMPLAINT:  Chief Complaint  Patient presents with   Follow-up    RM 2 with spouse Jonny Ruiz Pt is well, things are improving. Balance and correlation has gotten better day, has about 3 headaches a week. Does experience quick flashes of head pain at times.     HPI:   Today, 08/20/2021, Mrs. Roane returns for 76-month stroke follow-up accompanied by her husband, Jonny Ruiz.  Reports gradual improvement since prior visit.  Completed PT today and SLP last week.  She continues to work with improving imbalance and dizziness which can worsen with overexertion, increased need of attention or focusing or in crowded areas with overstimulation but this is been gradually improving.  She has been gradually returning back to driving.  She has not yet returned back to work but has follow-up with PCP at the end of September to further discuss.  Left-sided headaches have greatly improved currently occurring 3 times weekly. Takes tylenol if severe with some benefit.  Remains on topiramate 50 mg nightly.  Denies new stroke/TIA symptoms.  Remains on aspirin without side effects.  Atorvastatin discontinued due to myalgias which resolved quickly after stopping.  She has drastically changed her diet with avoidance of red meats and mainly fish and fresh vegetables.  Repeat CTA head/neck showed improvement of vertebral arteries. Fm hx of multiple double-jointed joints and concern of possible connective tissue disorder which could have contributed to dissection as no clear cause identified.  No further concerns at this time.     History provided for reference purposes Initial visit 05/06/2021 JM Ms. McClung-Hill is being seen for hospital follow-up  accompanied by her husband, Jonny Ruiz  Stable from stroke standpoint without new or worsening stroke/TIA symptoms -imbalance but improving - currently working with PT. ambulates without assistive device -Occasional dizziness usually with increased exertion but overall greatly improving.  She trial use of meclizine once which was discharged hospitalization but unsure of benefit as she fell asleep - symptoms quickly subsided after sitting and resting -LUE weakness/incoordination improving with only mild difficulty -currently working with OT.   -Daily headache which can increase in severity approx 2-3x per week usually with increased activity which are debilitating and associated with photophobia and phonophobia.  Denies nausea/vomiting.  Occasional use of Tylenol with some benefit.  Denies history of frequent headaches or migraines -Mild neck pain but overall greatly improving.  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 Hx of anxiety at baseline currently mild denies worsening stroke.  Denies depression  Compliant on aspirin and Plavix without associated side effects Compliant on atorvastatin without associated side effects Blood pressure today 121/78 -does not routinely monitor at home  Asked appropriate questions during visit such as any physical restrictions or limitations in setting of dissection, cause of dissection and typical recovery time, and ongoing use of above medications  No further concerns at this time   Stroke admission 04/09/2021 Ms. MAEBEL MARASCO is a 30 y.o. female with no significant PMH who presented on 04/09/2021 with headache, neck pain, and dizziness.    Personally reviewed hospitalization pertinent progress  notes, lab work and imaging with summary provided.  Evaluated by Dr. 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  Recommended DAPT for 3 months then aspirin alone and repeat CTA head/neck 2-3 months for monitoring.  LDL 122 - started atorvastatin 40 mg daily.  Questionable BA tip small aneurysm on CTA head -recommended follow-up imaging.  Continued headaches, dizziness and nausea with symptom management with use of migraine cocktail hand tramadol, meclizine and Zofran.   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 right vertebral artery is diminutive and slightly irregular MRI left PICA infarct Repeat CT: No progression or hemorrhage at the left cerebellar infarct. The fourth ventricle is patent. 2D Echo: EF 60-65% LDL 122 HgbA1c 5.2 UDS negative VTE prophylaxis - SCDs Diet: heart healthy No antithrombotic prior to admission, now on aspirin 325 mg daily and clopidogrel 75 mg daily.  Continue DAPT for 3 months and then aspirin 81 alone. Therapy recommendations:  Outpatient PT Disposition: home     ROS:   14 system review of systems performed and negative with exception of those listed in HPI  PMH: History reviewed. No pertinent past medical history.  PSH:  Past Surgical History:  Procedure Laterality Date   KNEE SURGERY     TONSILLECTOMY      Social History:  Social History   Socioeconomic History   Marital status: Single    Spouse name: Not on file   Number of children: Not on file   Years of education: Not on file   Highest education level: Not on file  Occupational History   Not on file  Tobacco Use   Smoking status: Never   Smokeless tobacco: Never  Substance and Sexual Activity   Alcohol use: Yes   Drug use: No   Sexual activity: Yes    Birth control/protection: Condom  Other Topics Concern   Not on file  Social History Narrative   ** Merged History Encounter **       Social Determinants  of Health   Financial Resource Strain: Not on file  Food Insecurity: Not on file  Transportation Needs: Not on file  Physical Activity: Not on file  Stress: Not on file  Social Connections: Not on file  Intimate Partner Violence: Not on file    Family History: History reviewed. No pertinent family history.  Medications:   Current Outpatient Medications on File Prior to Visit  Medication Sig Dispense Refill   aspirin EC 81 MG tablet Take 4 tablets (325 mg total) by mouth daily.     topiramate (TOPAMAX) 50 MG tablet Take 1 tablet (50 mg total) by mouth at bedtime. 30 tablet 5   No current facility-administered medications on file prior to visit.    Allergies:   Allergies  Allergen Reactions   Ceclor [Cefaclor]    Penicillins       OBJECTIVE:  Physical Exam  Vitals:   08/20/21 1537  BP: 119/75  Pulse: 81  Weight: 141 lb (64 kg)  Height: 5\' 7"  (1.702 m)   Body mass index is 22.08 kg/m. No results found.  General: well developed, well nourished, young very pleasant Caucasian female, seated, in no evident distress Head: head normocephalic and atraumatic.   Neck: supple with no carotid or supraclavicular bruits Cardiovascular: regular rate and rhythm, no murmurs Musculoskeletal: no deformity Skin:  no rash/petichiae  Vascular:  Normal pulses all extremities   Neurologic Exam Mental Status: Awake and fully alert. Fluent speech and language.  Oriented to place and time. Recent and remote memory intact. Attention span, concentration and fund of knowledge appropriate. Mood and affect appropriate.  Cranial Nerves: Pupils equal, briskly reactive to light. Extraocular movements full without nystagmus. Visual fields full to confrontation. Hearing intact. Facial sensation intact. Face, tongue, palate moves normally and symmetrically.  Motor: Normal bulk and tone. Normal strength in all tested extremity muscles Sensory.: intact to touch , pinprick , position and vibratory  sensation.  Coordination: Rapid alternating movements normal in all extremities. Finger-to-nose and heel-to-shin performed accurately bilaterally. Gait and Station: Arises from chair without difficulty. Stance is normal. Gait demonstrates normal stride length and balance without use of assistive device. Tandem walk and heel toe without difficulty.  Romberg negative Reflexes: 1+ and symmetric. Toes downgoing.         ASSESSMENT: HALLI EQUIHUA is a 29 y.o. 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 Vascular risk factors include bilateral vertebral artery dissection, and HLD     PLAN:  L PICA stroke :  Residual deficit: Overall greatly improving with residual mild imbalance, mild cognitive difficulties and headache.  Recently completed therapies.  Continue to do HEP.  Remains on disability with follow-up with PCP at the end of September to discuss potential return.  PCP assessing disability. Vascular headache: continue topiramate 50 mg nightly.  Trial Nurtec 75 mg daily PRN for acute migraine relief.  Samples provided. She will call office if beneficial Continue aspirin 325 mg daily for secondary stroke prevention.   Discussed secondary stroke prevention measures and importance of close PCP follow up for aggressive stroke risk factor management  Bilateral vertebral artery dissection:  CTA head/neck 07/31/2021 Essentially resolved previously identified irregularity and narrowing of the vertebral arteries. CTA head/neck 03/2021 left vertebral artery high grade stenosis at C1 level. right vertebral artery is diminutive and stenosis also at the C1 level. Will complete lab work to rule out connective tissue disorder as no known etiology identified and family and personal history of joint hypermobility but no prior diagnosis or work-up HLD: LDL goal <70. Prior LDL 122.  Intolerant to statins.   Drastically modify diet.  Plans to repeat lipid panel next month with PCP    Follow up in 4 months or call earlier if needed   CC:  [facility] provider: Dr. Pearlean Brownie PCP: Royann Shivers, PA-C    I spent 36 minutes of face-to-face and non-face-to-face time with patient and husband.  This included previsit chart review, order entry, electronic health record documentation, and patient and husband education regarding prior stroke, possible etiologies of VA dissection and further evaluation, secondary stroke prevention measures, residual deficits and further recovery and answered all other questions to patient and husband satisfaction   Ihor Austin, AGNP-BC  [facility] [address] [place], [place] [address]  Phone [phone] Fax [phone] Note: This document was prepared with digital dictation and possible smart phrase technology. Any transcriptional errors that result from this process are unintentional.

## 2021-08-22 LAB — LUPUS ANTICOAGULANT PANEL
Dilute Viper Venom Time: 28.1 s (ref 0.0–47.0)
PTT Lupus Anticoagulant: 40.2 s (ref 0.0–51.9)

## 2021-08-22 LAB — ANA W/REFLEX IF POSITIVE: Anti Nuclear Antibody (ANA): NEGATIVE

## 2021-08-22 LAB — RHEUMATOID FACTOR: Rheumatoid fact SerPl-aCnc: <10 IU/mL (ref <14.0)

## 2021-08-26 ENCOUNTER — Ambulatory Visit: Payer: 59

## 2021-09-18 ENCOUNTER — Encounter: Payer: Self-pay | Admitting: Adult Health

## 2021-09-18 ENCOUNTER — Other Ambulatory Visit: Payer: Self-pay | Admitting: *Deleted

## 2021-09-18 DIAGNOSIS — R519 Headache, unspecified: Secondary | ICD-10-CM

## 2021-09-18 DIAGNOSIS — G441 Vascular headache, not elsewhere classified: Secondary | ICD-10-CM

## 2021-09-18 MED ORDER — NURTEC 75 MG PO TBDP: 75.0000 mg | ORAL_TABLET | Freq: Every day | ORAL | 11 refills | Status: DC | PRN

## 2021-09-18 NOTE — Telephone Encounter (Signed)
Yes please. Can refill x11

## 2021-11-18 ENCOUNTER — Other Ambulatory Visit: Payer: Self-pay | Admitting: *Deleted

## 2021-11-18 MED ORDER — TOPIRAMATE 50 MG PO TABS: 50.0000 mg | ORAL_TABLET | Freq: Every day | ORAL | 0 refills | Status: DC

## 2021-12-29 ENCOUNTER — Other Ambulatory Visit: Payer: Self-pay | Admitting: *Deleted

## 2021-12-29 MED ORDER — TOPIRAMATE 50 MG PO TABS: 50.0000 mg | ORAL_TABLET | Freq: Every day | ORAL | 5 refills | Status: DC

## 2021-12-31 ENCOUNTER — Ambulatory Visit: Payer: 59 | Admitting: Adult Health

## 2022-03-16 ENCOUNTER — Telehealth: Payer: Self-pay | Admitting: Adult Health

## 2022-03-16 NOTE — Telephone Encounter (Signed)
pt lv wanting to schedule but got new insurance- tried calling her by mail box is full  ?

## 2022-03-19 ENCOUNTER — Other Ambulatory Visit: Payer: Self-pay | Admitting: Adult Health

## 2022-03-19 ENCOUNTER — Encounter: Payer: Self-pay | Admitting: Adult Health

## 2022-03-19 DIAGNOSIS — I725 Aneurysm of other precerebral arteries: Secondary | ICD-10-CM

## 2022-03-19 DIAGNOSIS — I7774 Dissection of vertebral artery: Secondary | ICD-10-CM

## 2022-03-19 DIAGNOSIS — I639 Cerebral infarction, unspecified: Secondary | ICD-10-CM

## 2022-03-19 NOTE — Telephone Encounter (Signed)
I am not seeing any reason why a repeat CTA head/neck needs to be completed unless I am missing something? This was completed back in August which showed resolution of prior abnormalities. ?

## 2022-03-19 NOTE — Telephone Encounter (Signed)
pt has [employer] order sent to GI, they will obtain the auth and reach out to the patient to schedule.  ?

## 2022-03-20 ENCOUNTER — Telehealth: Payer: Self-pay | Admitting: Adult Health

## 2022-03-20 NOTE — Telephone Encounter (Signed)
[employer] [facility] obtains the auth, the patient is scheduled for 03/24/22. ?

## 2022-03-20 NOTE — Telephone Encounter (Signed)
FYI She is scheduled at [facility] for 03/24/22.  ?

## 2022-03-23 NOTE — Telephone Encounter (Signed)
Please contact patient - a repeat CTA is not needed as her prior imaging showed resolution of prior concerning findings. Verbal order placed by Chong Sicilian (maybe from imaging?) but again this imaging is not needed to be repeated.  ?

## 2022-03-24 ENCOUNTER — Other Ambulatory Visit: Payer: 59

## 2022-09-21 ENCOUNTER — Ambulatory Visit: Payer: Managed Care, Other (non HMO) | Admitting: Adult Health

## 2022-09-24 NOTE — Progress Notes (Signed)
[facility] [address] [place]. [place] [address] [phone]       STROKE FOLLOW UP NOTE  Ms. Alieah Brinton McClung-Hill Date of Birth:  Jul 15, 1991 Medical Record Number:  [id number]   Reason for Referral: stroke follow up    SUBJECTIVE:   CHIEF COMPLAINT:  Chief Complaint  Patient presents with   Follow-up stroke    Pt reports feeling good. She still does have headaches periodically. Room 3 with husband    HPI:    Update 09/27/2022 JM: Patient returns for follow-up after prior visit over 1 year ago accompanied by her husband.  Reports great improvement of balance, can have issues with looking up at times and more sensitive to activities and may cause motion sickness.  Denies new stroke/TIA symptoms.  Does have occasional headaches about 3 per month, can have tinnitus or dizziness prior to headache onset. Will use Nurtec with benefit. No longer used topiramate.  Compliant on aspirin, denies side effects.  Did check labs at prior visit to rule out connective tissue disorder which were all unremarkable.  No new concerns at this time.      History provided for reference purposes only Update 08/20/2021 JM: Mrs. McClung-Hill returns for 26-month stroke follow-up accompanied by her husband, Jenny Reichmann.  Reports gradual improvement since prior visit.  Completed PT today and SLP last week.  She continues to work with improving imbalance and dizziness which can worsen with overexertion, increased need of attention or focusing or in crowded areas with overstimulation but this is been gradually improving.  She has been gradually returning back to driving.  She has not yet returned back to work but has follow-up with PCP at the end of September to further discuss.  Left-sided headaches have greatly improved currently occurring 3 times weekly. Takes tylenol if severe with some benefit.  Remains on topiramate 50 mg nightly.  Denies new stroke/TIA symptoms.  Remains on aspirin without  side effects.  Atorvastatin discontinued due to myalgias which resolved quickly after stopping.  She has drastically changed her diet with avoidance of red meats and mainly fish and fresh vegetables.  Repeat CTA head/neck showed improvement of vertebral arteries. Fm hx of multiple double-jointed joints and concern of possible connective tissue disorder which could have contributed to dissection as no clear cause identified.  No further concerns at this time.   Initial visit 05/06/2021 JM Ms. McClung-Hill is being seen for hospital follow-up accompanied by her husband, Jenny Reichmann  Stable from stroke standpoint without new or worsening stroke/TIA symptoms -imbalance but improving - currently working with PT. ambulates without assistive device -Occasional dizziness usually with increased exertion but overall greatly improving.  She trial use of meclizine once which was discharged hospitalization but unsure of benefit as she fell asleep - symptoms quickly subsided after sitting and resting -LUE weakness/incoordination improving with only mild difficulty -currently working with OT.   -Daily headache which can increase in severity approx 2-3x per week usually with increased activity which are debilitating and associated with photophobia and phonophobia.  Denies nausea/vomiting.  Occasional use of Tylenol with some benefit.  Denies history of frequent headaches or migraines -Mild neck pain but overall greatly improving.  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 Hx of anxiety at baseline currently mild denies worsening stroke.  Denies depression  Compliant on aspirin and Plavix without associated  side effects Compliant on atorvastatin without associated side effects Blood pressure today 121/78 -does not routinely monitor at home  Asked appropriate questions  during visit such as any physical restrictions or limitations in setting of dissection, cause of dissection and typical recovery time, and ongoing use of above medications  No further concerns at this time   Stroke admission 04/09/2021 Ms. FOSTER SONNIER is a 31 y.o. female with no significant PMH who presented on 04/09/2021 with headache, neck pain, and dizziness.    Personally reviewed hospitalization pertinent progress notes, lab work and imaging with summary provided.  Evaluated by Dr. 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  Recommended DAPT for 3 months then aspirin alone and repeat CTA head/neck 2-3 months for monitoring.  LDL 122 - started atorvastatin 40 mg daily.  Questionable BA tip small aneurysm on CTA head -recommended follow-up imaging.  Continued headaches, dizziness and nausea with symptom management with use of migraine cocktail hand tramadol, meclizine and Zofran.   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 right vertebral artery is diminutive and slightly irregular MRI left PICA infarct Repeat CT: No progression or hemorrhage at the left cerebellar infarct. The fourth ventricle is patent. 2D Echo: EF 60-65% LDL 122 HgbA1c 5.2 UDS negative VTE prophylaxis - SCDs Diet: heart healthy No antithrombotic prior to admission, now on aspirin 325 mg daily and clopidogrel 75 mg daily.  Continue DAPT for 3 months and then aspirin 81 alone. Therapy recommendations:  Outpatient PT Disposition: home     ROS:   14 system review of systems performed and negative with exception of those listed in HPI  PMH: No past medical history on file.  PSH:  Past Surgical History:  Procedure Laterality Date   KNEE SURGERY     TONSILLECTOMY      Social History:  Social History   Socioeconomic History    Marital status: Married    Spouse name: Not on file   Number of children: Not on file   Years of education: Not on file   Highest education level: Not on file  Occupational History   Not on file  Tobacco Use   Smoking status: Never   Smokeless tobacco: Never  Substance and Sexual Activity   Alcohol use: Yes   Drug use: No   Sexual activity: Yes    Birth control/protection: Condom  Other Topics Concern   Not on file  Social History Narrative   ** Merged History Encounter **       Social Determinants of Health   Financial Resource Strain: Not on file  Food Insecurity: Not on file  Transportation Needs: Not on file  Physical Activity: Not on file  Stress: Not on file  Social Connections: Not on file  Intimate Partner Violence: Not on file    Family History: No family history on file.  Medications:   Current Outpatient Medications on File Prior to Visit  Medication Sig Dispense Refill   aspirin EC 81 MG tablet Take 4 tablets (325 mg total) by mouth daily. (Patient taking differently: Take 81 mg by mouth daily.)     Rimegepant Sulfate (NURTEC) 75 MG TBDP Take 75 mg by mouth daily as needed. 8 tablet 11   topiramate (TOPAMAX) 50 MG tablet Take 1 tablet (50 mg total) by mouth at bedtime. (Patient not taking: Reported  on 09/28/2022) 30 tablet 5   No current facility-administered medications on file prior to visit.    Allergies:   Allergies  Allergen Reactions   Ceclor [Cefaclor]    Penicillins       OBJECTIVE:  Physical Exam  Vitals:   09/28/22 1332  BP: (!) 140/80  Pulse: 81  Weight: 150 lb 6 oz (68.2 kg)  Height: 5\' 7"  (1.702 m)   Body mass index is 23.55 kg/m. No results found.   General: well developed, well nourished, young very pleasant Caucasian female, seated, in no evident distress Head: head normocephalic and atraumatic.   Neck: supple with no carotid or supraclavicular bruits Cardiovascular: regular rate and rhythm, no  murmurs Musculoskeletal: no deformity Skin:  no rash/petichiae Vascular:  Normal pulses all extremities   Neurologic Exam Mental Status: Awake and fully alert. Fluent speech and language.  Oriented to place and time. Recent and remote memory intact. Attention span, concentration and fund of knowledge appropriate. Mood and affect appropriate.  Cranial Nerves: Pupils equal, briskly reactive to light. Extraocular movements full without nystagmus. Visual fields full to confrontation. Hearing intact. Facial sensation intact. Face, tongue, palate moves normally and symmetrically.  Motor: Normal bulk and tone. Normal strength in all tested extremity muscles Sensory.: intact to touch , pinprick , position and vibratory sensation.  Coordination: Rapid alternating movements normal in all extremities. Finger-to-nose and heel-to-shin performed accurately bilaterally. Gait and Station: Arises from chair without difficulty. Stance is normal. Gait demonstrates normal stride length and balance without use of assistive device. Tandem walk and heel toe without difficulty.  Romberg negative Reflexes: 1+ and symmetric. Toes downgoing.         ASSESSMENT: PAYTIENCE BURES is a 31 y.o. 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 Vascular risk factors include bilateral vertebral artery dissection, and HLD     PLAN:  L PICA stroke :  Residual deficit: Overall greatly improving with residual occasional imbalance with certain activities Headache, mixed with some migrainous features: currently about 3/month, no indication for prophylactic therapy.  Continue Nurtec 75 mg PRN for acute migraine relief.   Continue aspirin 81 mg daily for secondary stroke prevention.   Intolerant to statins - she plans on f/u with PCP for repeat lipid panel, declined checking today Discussed secondary stroke prevention measures  and importance of close PCP follow up for aggressive stroke risk factor management  Bilateral vertebral artery dissection:  CTA head/neck 07/31/2021 Essentially resolved previously identified irregularity and narrowing of the vertebral arteries. CTA head/neck 03/2021 left vertebral artery high grade stenosis at C1 level. right vertebral artery is diminutive and stenosis also at the C1 level. Connective tissue disorder rule out No indication for repeat CTA head/neck in the future at this time as prior CTA head/neck showed resolution of dissection.     Follow up in 1 year unless PCP able to refill ongoing as she now lives over 2 hours away. If able to refill, can f/u as needed, if not, will see in 1 year, can do VV if patient wishes.  She was advised to call earlier if needed.   CC:  PCP: 04/2021, PA-C     I spent 31 minutes of face-to-face and non-face-to-face time with patient and husband.  This included previsit chart review, order entry, electronic health record documentation, and patient and husband education regarding above diagnoses and treatment plan and answered all  the questions to patient and husband satisfaction   Ihor Austin, [facility]  [facility] [address] [place], [place] [address]  Phone [phone] Fax [phone] Note: This document was prepared with digital dictation and possible smart phrase technology. Any transcriptional errors that result from this process are unintentional.

## 2022-09-28 ENCOUNTER — Encounter: Payer: Self-pay | Admitting: Adult Health

## 2022-09-28 ENCOUNTER — Ambulatory Visit: Payer: Managed Care, Other (non HMO) | Admitting: Adult Health

## 2022-09-28 VITALS — BP 140/80 | HR 81 | Ht 67.0 in | Wt 150.4 lb

## 2022-09-28 DIAGNOSIS — I639 Cerebral infarction, unspecified: Secondary | ICD-10-CM

## 2022-09-28 DIAGNOSIS — G43109 Migraine with aura, not intractable, without status migrainosus: Secondary | ICD-10-CM | POA: Diagnosis not present

## 2022-09-28 DIAGNOSIS — I7774 Dissection of vertebral artery: Secondary | ICD-10-CM

## 2022-09-28 DIAGNOSIS — G441 Vascular headache, not elsewhere classified: Secondary | ICD-10-CM | POA: Diagnosis not present

## 2022-09-28 MED ORDER — NURTEC 75 MG PO TBDP: 75.0000 mg | ORAL_TABLET | Freq: Every day | ORAL | 11 refills | Status: DC | PRN

## 2022-09-28 MED ORDER — ASPIRIN 81 MG PO TBEC: 81.0000 mg | DELAYED_RELEASE_TABLET | Freq: Every day | ORAL | 12 refills | Status: AC

## 2022-09-28 MED ORDER — NURTEC 75 MG PO TBDP: 75.0000 mg | ORAL_TABLET | Freq: Every day | ORAL | 11 refills | Status: AC | PRN

## 2022-09-28 NOTE — Patient Instructions (Addendum)
Your Plan:  Continue aspirin 81mg  daily for stroke prevention  Continue use of Nurtec as needed for migraine rescue    Follow up in 1 year or call earlier if needed     Thank you for coming to see [place] at [facility]. I hope we have been able to provide you high quality care today.  You may receive a patient satisfaction survey over the next few weeks. We would appreciate your feedback and comments so that we may continue to improve ourselves and the health of our patients.

## 2023-05-18 ENCOUNTER — Telehealth: Payer: Self-pay | Admitting: Adult Health

## 2023-05-18 NOTE — Telephone Encounter (Signed)
Pt called needing to discuss possible medications change due to her being pregnant. Please advise.

## 2023-05-19 NOTE — Telephone Encounter (Signed)
Late entry. I called the patient yesterday 05/18/23 at 5:24 pm, regarding her message. Pt is [redacted] weeks pregnant and her OB doctor wanted her to ask her neurologist  about her risk of a stroke during pregnancy as well as medications she could take for her headaches. Dr Pearlean Brownie came into the Pod while I was speaking with patient. I was able to verbally discuss with him the patient's concerns. Patient has already been informed of Dr Marlis Edelson response through my chart message.

## 2023-05-19 NOTE — Telephone Encounter (Signed)
Hi Caroline Graham,  Per Dr Pearlean Brownie, he states that if your [facility] doctor increased your aspirin to 162 mg is fine, 81 mg will be fine as well. For headaches you can take Tylenol. Dr Pearlean Brownie also stated monitor your cholesterol, blood sugars and blood pressure to keep this on a normal level and if any signs of stroke to go to the ER immediately. Dr Pearlean Brownie is aware of what is going on, from his stand point he doesn't feel you need to be seen urgently. If you'd like to make an appointment with our nurse practitioner Caroline Graham, please call our office at [phone] and schedule an appointment with her at her first available in July as she is out on maternity leave currently. Thank you.

## 2023-06-21 NOTE — Telephone Encounter (Signed)
Pt said she has been waiting for a phone call about her concerns regarding a pregnancy and stroke. Would like to know if need to on preventative medication and additional what method of birth is best. Would like a call from the nurse.

## 2023-06-21 NOTE — Telephone Encounter (Signed)
Pt is [redacted] weeks pregnant, states she has seen her OB GYN doctor recently and she wants her to ask her Neurologist about what method of birth is best, a vaginal delivery or C-section with her history of stroke and dissection of vertebral artery. States her OB would like her neurologist to decide this for her. 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 Pt is concerned any of this might affect her pregnancy.  Do you recommend an updated imaging was April of 2022, do you need to reassess that. Please advise.

## 2023-07-21 ENCOUNTER — Ambulatory Visit: Payer: Managed Care, Other (non HMO) | Admitting: Adult Health

## 2023-07-21 NOTE — Progress Notes (Deleted)
[facility] [address] [place].  [address] [phone]       STROKE FOLLOW UP NOTE  [name] Date of Birth:  11-21-1991 Medical Record Number:  [id number]   Reason for Referral: stroke follow up    SUBJECTIVE:   CHIEF COMPLAINT:  No chief complaint on file.   HPI:   Update 07/21/2023 JM:         History provided for reference purposes only Update 09/27/2022 JM: Patient returns for follow-up after prior visit over 1 year ago accompanied by her husband.  Reports great improvement of balance, can have issues with looking up at times and more sensitive to activities and may cause motion sickness.  Denies new stroke/TIA symptoms.  Does have occasional headaches about 3 per month, can have tinnitus or dizziness prior to headache onset. Will use Nurtec with benefit. No longer used topiramate.  Compliant on aspirin, denies side effects.  Did check labs at prior visit to rule out connective tissue disorder which were all unremarkable.  No new concerns at this time.  Update 08/20/2021 JM: Caroline Graham returns for 53-month stroke follow-up accompanied by her husband, Caroline Graham.  Reports gradual improvement since prior visit.  Completed PT today and SLP last week.  She continues to work with improving imbalance and dizziness which can worsen with overexertion, increased need of attention or focusing or in crowded areas with overstimulation but this is been gradually improving.  She has been gradually returning back to driving.  She has not yet returned back to work but has follow-up with PCP at the end of September to further discuss.  Left-sided headaches have greatly improved currently occurring 3 times weekly. Takes tylenol if severe with some benefit.  Remains on topiramate 50 mg nightly.  Denies new stroke/TIA symptoms.  Remains on aspirin without side effects.  Atorvastatin discontinued due to myalgias which resolved quickly after stopping.  She  has drastically changed her diet with avoidance of red meats and mainly fish and fresh vegetables.  Repeat CTA head/neck showed improvement of vertebral arteries. Fm hx of multiple double-jointed joints and concern of possible connective tissue disorder which could have contributed to dissection as no clear cause identified.  No further concerns at this time.   Initial visit 05/06/2021 JM Ms. Graham is being seen for hospital follow-up accompanied by her husband, Caroline Graham  Stable from stroke standpoint without new or worsening stroke/TIA symptoms -imbalance but improving - currently working with PT. ambulates without assistive device -Occasional dizziness usually with increased exertion but overall greatly improving.  She trial use of meclizine once which was discharged hospitalization but unsure of benefit as she fell asleep - symptoms quickly subsided after sitting and resting -LUE weakness/incoordination improving with only mild difficulty -currently working with OT.   -Daily headache which can increase in severity approx 2-3x per week usually with increased activity which are debilitating and associated with photophobia and phonophobia.  Denies nausea/vomiting.  Occasional use of Tylenol with some benefit.  Denies history of frequent headaches or migraines -Mild neck pain but overall greatly improving.  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 Hx of anxiety at baseline currently mild denies worsening stroke.  Denies depression  Compliant on aspirin and Plavix without associated side effects Compliant on atorvastatin without associated side effects Blood pressure today 121/78 -does not routinely monitor  at home  Asked appropriate questions during visit such as any physical restrictions or limitations in setting of dissection, cause of  dissection and typical recovery time, and ongoing use of above medications  No further concerns at this time   Stroke admission 04/09/2021 Caroline Graham is a 32 y.o. female with no significant PMH who presented on 04/09/2021 with headache, neck pain, and dizziness.    Personally reviewed hospitalization pertinent progress notes, lab work and imaging with summary provided.  Evaluated by Dr. 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  Recommended DAPT for 3 months then aspirin alone and repeat CTA head/neck 2-3 months for monitoring.  LDL 122 - started atorvastatin 40 mg daily.  Questionable BA tip small aneurysm on CTA head -recommended follow-up imaging.  Continued headaches, dizziness and nausea with symptom management with use of migraine cocktail hand tramadol, meclizine and Zofran.   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 right vertebral artery is diminutive and slightly irregular MRI left PICA infarct Repeat CT: No progression or hemorrhage at the left cerebellar infarct. The fourth ventricle is patent. 2D Echo: EF 60-65% LDL 122 HgbA1c 5.2 UDS negative VTE prophylaxis - SCDs Diet: heart healthy No antithrombotic prior to admission, now on aspirin 325 mg daily and clopidogrel 75 mg daily.  Continue DAPT for 3 months and then aspirin 81 alone. Therapy recommendations:  Outpatient PT Disposition: home     ROS:   14 system review of systems performed and negative with exception of those listed in HPI  PMH: No past medical history on file.  PSH:  Past Surgical History:  Procedure Laterality Date   KNEE SURGERY     TONSILLECTOMY      Social History:  Social History   Socioeconomic History   Marital status: Married    Spouse name: Not on file   Number of children: Not on file    Years of education: Not on file   Highest education level: Not on file  Occupational History   Not on file  Tobacco Use   Smoking status: Never   Smokeless tobacco: Never  Substance and Sexual Activity   Alcohol use: Yes   Drug use: No   Sexual activity: Yes    Birth control/protection: Condom  Other Topics Concern   Not on file  Social History Narrative   ** Merged History Encounter **       Social Determinants of Health   Financial Resource Strain: Not on file  Food Insecurity: No Food Insecurity (05/14/2023)   [facility]   Hunger Vital Sign    Worried About Running Out of Food in the Last Year: Never true    Ran Out of Food in the Last Year: Never true  Transportation Needs: Not on file  Physical Activity: Not on file  Stress: Not on file  Social Connections: Not on file  Intimate Partner Violence: Not on file    Family History: No family history on file.  Medications:   Current Outpatient Medications on File Prior to Visit  Medication Sig Dispense Refill   aspirin EC 81 MG tablet Take 1 tablet (81 mg total) by mouth daily. Swallow whole. 30 tablet 12   Rimegepant Sulfate (NURTEC) 75 MG TBDP Take 75 mg by mouth daily as  needed. 8 tablet 11   No current facility-administered medications on file prior to visit.    Allergies:   Allergies  Allergen Reactions   Ceclor [Cefaclor]    Penicillins       OBJECTIVE:  Physical Exam  There were no vitals filed for this visit.  There is no height or weight on file to calculate BMI. No results found.   General: well developed, well nourished, young very pleasant Caucasian female, seated, in no evident distress Head: head normocephalic and atraumatic.   Neck: supple with no carotid or supraclavicular bruits Cardiovascular: regular rate and rhythm, no murmurs Musculoskeletal: no deformity Skin:  no rash/petichiae Vascular:  Normal pulses all  extremities   Neurologic Exam Mental Status: Awake and fully alert. Fluent speech and language.  Oriented to place and time. Recent and remote memory intact. Attention span, concentration and fund of knowledge appropriate. Mood and affect appropriate.  Cranial Nerves: Pupils equal, briskly reactive to light. Extraocular movements full without nystagmus. Visual fields full to confrontation. Hearing intact. Facial sensation intact. Face, tongue, palate moves normally and symmetrically.  Motor: Normal bulk and tone. Normal strength in all tested extremity muscles Sensory.: intact to touch , pinprick , position and vibratory sensation.  Coordination: Rapid alternating movements normal in all extremities. Finger-to-nose and heel-to-shin performed accurately bilaterally. Gait and Station: Arises from chair without difficulty. Stance is normal. Gait demonstrates normal stride length and balance without use of assistive device. Tandem walk and heel toe without difficulty.  Romberg negative Reflexes: 1+ and symmetric. Toes downgoing.         ASSESSMENT: Caroline Graham is a 32 y.o. 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 Vascular risk factors include bilateral vertebral artery dissection, and HLD     PLAN:  L PICA stroke :  Residual deficit: Overall greatly improving with residual occasional imbalance with certain activities Headache, mixed with some migrainous features: currently about 3/month, no indication for prophylactic therapy.  Continue Nurtec 75 mg PRN for acute migraine relief.   Continue aspirin 81 mg daily for secondary stroke prevention.   Intolerant to statins - she plans on f/u with PCP for repeat lipid panel, declined checking today Discussed secondary stroke prevention measures and importance of close PCP follow up for aggressive stroke risk factor management  Bilateral  vertebral artery dissection:  CTA head/neck 07/31/2021 Essentially resolved previously identified irregularity and narrowing of the vertebral arteries. CTA head/neck 03/2021 left vertebral artery high grade stenosis at C1 level. right vertebral artery is diminutive and stenosis also at the C1 level. Connective tissue disorder rule out No indication for repeat CTA head/neck in the future at this time as prior CTA head/neck showed resolution of dissection.     Follow up in 1 year unless PCP able to refill ongoing as she now lives over 2 hours away. If able to refill, can f/u as needed, if not, will see in 1 year, can do VV if patient wishes.  She was advised to call earlier if needed.   CC:  PCP: Royann Shivers, PA-C     I spent 31 minutes of face-to-face and non-face-to-face time with patient and husband.  This included previsit chart review, order entry, electronic health record documentation, and patient and husband education regarding above diagnoses and treatment plan and answered all the questions to patient and husband satisfaction   Ihor Austin, AGNP-BC  [facility] [address] [place], [place] [address]  Phone [phone] Fax [phone] Note: This document was prepared with digital dictation and possible smart phrase technology. Any transcriptional errors that result from this process are unintentional.

## 2023-08-12 ENCOUNTER — Encounter: Payer: Self-pay | Admitting: Neurology

## 2023-08-12 ENCOUNTER — Ambulatory Visit: Payer: Managed Care, Other (non HMO) | Admitting: Neurology

## 2023-08-12 VITALS — BP 126/80 | HR 96 | Ht 66.0 in | Wt 176.2 lb

## 2023-08-12 DIAGNOSIS — Z8673 Personal history of transient ischemic attack (TIA), and cerebral infarction without residual deficits: Secondary | ICD-10-CM | POA: Diagnosis not present

## 2023-08-12 NOTE — Progress Notes (Signed)
[facility] [address] [place].  [address] [phone]       STROKE FOLLOW UP NOTE  [name] Date of Birth:  [birth year] Medical Record Number:  [id number]   Reason for Referral: stroke follow up    SUBJECTIVE:   CHIEF COMPLAINT:  Chief Complaint  Patient presents with   New Patient (Initial Visit)    Patient in room #7 with her husband. Patient states she here today to discuss her pregnancy.     HPI:  Update 08/12/2023 : Patient returns for follow-up after last visit with Shanda Bumps nurse practitioner 10 months ago.  She is accompanied by her husband.  She is now 7 months pregnant and is concerned about risk of recurrent dissection with labor and wants to discuss about what is the best way for her to deliver her baby.  Patient is doing well she denies recurrent stroke or TIA symptoms.  She was on aspirin 81 mg daily and her OB/GYN has increased the dose to 162 mg daily after she was pregnant.  She is tolerating this dose well without any side effects.  She discontinued Lipitor due to muscle aches and pains and states she has been eating healthy and wants to bring her cholesterol down the natural way.  She has not had any follow-up lipid profile checked.  She states her migraines have been fluctuating that improved in the second trimester but now seems to be becoming back.  She has stopped taking Nurtec.  She is still getting headaches once a week or so but she feels these are mild and she can manage them.  She has no other new complaints.  She did not undergo follow-up CT angiogram of the brain and neck on 07/31/2021 which had shown old left cerebellar infarct with significant resolution of the previous irregularity and narrowing seen in both terminal vertebral arteries.  Update 09/27/2022 JM: Patient returns for follow-up after prior visit over 1 year ago accompanied by her husband.  Reports great improvement of balance, can have issues with  looking up at times and more sensitive to activities and may cause motion sickness.  Denies new stroke/TIA symptoms.  Does have occasional headaches about 3 per month, can have tinnitus or dizziness prior to headache onset. Will use Nurtec with benefit. No longer used topiramate.  Compliant on aspirin, denies side effects.  Did check labs at prior visit to rule out connective tissue disorder which were all unremarkable.  No new concerns at this time.      History provided for reference purposes only Update 08/20/2021 JM: Mrs. Graham returns for 41-month stroke follow-up accompanied by her husband, Caroline Graham.  Reports gradual improvement since prior visit.  Completed PT today and SLP last week.  She continues to work with improving imbalance and dizziness which can worsen with overexertion, increased need of attention or focusing or in crowded areas with overstimulation but this is been gradually improving.  She has been gradually returning back to driving.  She has not yet returned back to work but has follow-up with PCP at the end of September to further discuss.  Left-sided headaches have greatly improved currently occurring 3 times weekly. Takes tylenol if severe with some benefit.  Remains on topiramate 50 mg nightly.  Denies new stroke/TIA symptoms.  Remains on aspirin without side effects.  Atorvastatin discontinued due to myalgias which resolved quickly after stopping.  She has drastically changed her diet with avoidance of red meats and mainly fish and fresh  vegetables.  Repeat CTA head/neck showed improvement of vertebral arteries. Fm hx of multiple double-jointed joints and concern of possible connective tissue disorder which could have contributed to dissection as no clear cause identified.  No further concerns at this time.   Initial visit 05/06/2021 JM Caroline Graham is being seen for hospital follow-up accompanied by her husband, Caroline Graham  Stable from stroke standpoint without new or worsening  stroke/TIA symptoms -imbalance but improving - currently working with PT. ambulates without assistive device -Occasional dizziness usually with increased exertion but overall greatly improving.  She trial use of meclizine once which was discharged hospitalization but unsure of benefit as she fell asleep - symptoms quickly subsided after sitting and resting -LUE weakness/incoordination improving with only mild difficulty -currently working with OT.   -Daily headache which can increase in severity approx 2-3x per week usually with increased activity which are debilitating and associated with photophobia and phonophobia.  Denies nausea/vomiting.  Occasional use of Tylenol with some benefit.  Denies history of frequent headaches or migraines -Mild neck pain but overall greatly improving.  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 Hx of anxiety at baseline currently mild denies worsening stroke.  Denies depression  Compliant on aspirin and Plavix without associated side effects Compliant on atorvastatin without associated side effects Blood pressure today 121/78 -does not routinely monitor at home  Asked appropriate questions during visit such as any physical restrictions or limitations in setting of dissection, cause of dissection and typical recovery time, and ongoing use of above medications  No further concerns at this time   Stroke admission 04/09/2021 Caroline Graham is a 32 y.o. female with no significant PMH who presented on 04/09/2021 with headache, neck pain, and dizziness.    Personally reviewed hospitalization pertinent progress notes, lab work and imaging with summary provided.  Evaluated by Dr. 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  Recommended DAPT for 3 months then aspirin alone and repeat CTA head/neck 2-3 months for monitoring.  LDL 122 - started atorvastatin 40 mg daily.  Questionable BA tip small aneurysm on CTA head -recommended follow-up imaging.  Continued headaches, dizziness and nausea with symptom management with use of migraine cocktail hand tramadol, meclizine and Zofran.   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 right vertebral artery is diminutive and slightly irregular MRI left PICA infarct Repeat CT: No progression or hemorrhage at the left cerebellar infarct. The fourth ventricle is patent. 2D Echo: EF 60-65% LDL 122 HgbA1c 5.2 UDS negative VTE prophylaxis - SCDs Diet: heart healthy No antithrombotic prior to admission, now on aspirin 325 mg daily and clopidogrel 75 mg daily.  Continue DAPT for 3 months and then aspirin 81 alone. Therapy recommendations:  Outpatient PT Disposition: home     ROS:   14 system review of systems performed and negative with exception of those listed in HPI  PMH: History reviewed. No pertinent past medical history.  PSH:  Past Surgical History:  Procedure Laterality Date   KNEE SURGERY     TONSILLECTOMY      Social History:  Social History   Socioeconomic History   Marital status: Married    Spouse name: Not on file  Number of children: Not on file   Years of education: Not on file   Highest education level: Not on file  Occupational History   Not on file  Tobacco Use   Smoking status: Never   Smokeless tobacco: Never  Substance and Sexual Activity   Alcohol use: Yes   Drug use: No   Sexual activity: Yes    Birth control/protection: Condom  Other Topics Concern   Not on file  Social History Narrative   ** Merged History Encounter **       Social Determinants of Health   Financial Resource Strain: Not on file  Food Insecurity: No Food  Insecurity (05/14/2023)   [facility]   Hunger Vital Sign    Worried About Running Out of Food in the Last Year: Never true    Ran Out of Food in the Last Year: Never true  Transportation Needs: Not on file  Physical Activity: Not on file  Stress: Not on file  Social Connections: Not on file  Intimate Partner Violence: Not on file    Family History: History reviewed. No pertinent family history.  Medications:   Current Outpatient Medications on File Prior to Visit  Medication Sig Dispense Refill   aspirin EC 81 MG tablet Take 1 tablet (81 mg total) by mouth daily. Swallow whole. (Patient taking differently: Take 162 mg by mouth daily. Swallow whole.) 30 tablet 12   Ferrous Sulfate (IRON PO) Take by mouth.     Rimegepant Sulfate (NURTEC) 75 MG TBDP Take 75 mg by mouth daily as needed. (Patient not taking: Reported on 08/12/2023) 8 tablet 11   No current facility-administered medications on file prior to visit.    Allergies:   Allergies  Allergen Reactions   Ceclor [Cefaclor]    Penicillins       OBJECTIVE:  Physical Exam  Vitals:   08/12/23 1513  BP: 126/80  Pulse: 96  Weight: 176 lb 3.2 oz (79.9 kg)  Height: 5\' 6"  (1.676 m)   Body mass index is 28.44 kg/m. No results found.   General: well developed, well nourished, young very pleasant Caucasian female, seated, in no evident distress Head: head normocephalic and atraumatic.   Neck: supple with no carotid or supraclavicular bruits Cardiovascular: regular rate and rhythm, no murmurs Musculoskeletal: no deformity Skin:  no rash/petichiae Vascular:  Normal pulses all extremities   Neurologic Exam Mental Status: Awake and fully alert. Fluent speech and language.  Oriented to place and time. Recent and remote memory intact. Attention span, concentration and fund of knowledge appropriate. Mood and affect appropriate.  Cranial Nerves: Pupils equal,  briskly reactive to light. Extraocular movements full without nystagmus. Visual fields full to confrontation. Hearing intact. Facial sensation intact. Face, tongue, palate moves normally and symmetrically.  Motor: Normal bulk and tone. Normal strength in all tested extremity muscles Sensory.: intact to touch , pinprick , position and vibratory sensation.  Coordination: Rapid alternating movements normal in all extremities. Finger-to-nose and heel-to-shin performed accurately bilaterally. Gait and Station: Arises from chair without difficulty. Stance is normal. Gait demonstrates normal stride length and balance without use of assistive device. Tandem walk and heel toe without difficulty.  Romberg negative Reflexes: 1+ and symmetric. Toes downgoing.         ASSESSMENT: MADISYNN RUBANO is a 32 y.o. 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  Follow-up imaging in August 2022 had shown significant resolution of the previously seen irregularity and narrowing of the vertebral artery. vascular risk factors include bilateral vertebral artery dissection, and HLD     PLAN: I had a long d/w patient and her husband about her remote left cerebellar , bilateral terminal vertebral artery narrowing versus dissection which appears to have resolved , risk for recurrent stroke/TIAs, personally independently reviewed imaging studies and stroke evaluation results and answered questions.Continue 162 mg daily for secondary stroke prevention and maintain strict control of hypertension with blood pressure goal below 130/90, diabetes with hemoglobin A1c goal below 6.5% and lipids with LDL cholesterol goal below 70 mg/dL. I also advised the patient to eat a healthy diet with plenty of whole grains, cereals, fruits and vegetables, exercise regularly and maintain ideal body weight .check follow-up lipid profile  and hemoglobin A1c today and if not satisfactory may need to go back on statin.  I am not quite sure patient had vertebral artery terminal dissection in the absence of significant head injury or some sort of transient vasculopathy which appears to have resolved hence I will not make a strong recommendation for her delivery to be C-section only at this time.  Pregnancy carries 2 times higher risk for dissection of the arteries particularly during labor however we do not have enough data to make a strong recommendation.  On patient with prior history of dissection going through labor at this time.  This will have to be individualized decision after careful discussion about risk benefits of C-section between the patient and her OB/GYN physician.  Followup in the future with Shanda Bumps, nurse practitioner in 3 months or call earlier if necessary.  Delia Heady, MD  [facility] [address] [place], [place] [address]  Phone [phone] Fax [phone] Note: This document was prepared with digital dictation and possible smart phrase technology. Any transcriptional errors that result from this process are unintentional.

## 2023-08-12 NOTE — Patient Instructions (Signed)
I had a long d/w patient and her husband about her remote left cerebellar , bilateral terminal vertebral artery narrowing versus dissection which appears to have resolved , risk for recurrent stroke/TIAs, personally independently reviewed imaging studies and stroke evaluation results and answered questions.Continue 162 mg daily for secondary stroke prevention and maintain strict control of hypertension with blood pressure goal below 130/90, diabetes with hemoglobin A1c goal below 6.5% and lipids with LDL cholesterol goal below 70 mg/dL. I also advised the patient to eat a healthy diet with plenty of whole grains, cereals, fruits and vegetables, exercise regularly and maintain ideal body weight .check follow-up lipid profile and hemoglobin A1c today and if not satisfactory may need to go back on statin.  I am not quite sure patient had vertebral artery terminal dissection in the absence of significant head injury or some sort of transient vasculopathy which appears to have resolved hence I will not make a strong recommendation for her delivery to be C-section only at this time.  Followup in the future with Shanda Bumps, nurse practitioner in 3 months or call earlier if necessary.

## 2023-09-09 ENCOUNTER — Institutional Professional Consult (permissible substitution): Payer: Managed Care, Other (non HMO) | Admitting: Neurology

## 2023-09-29 ENCOUNTER — Ambulatory Visit: Payer: Managed Care, Other (non HMO) | Admitting: Adult Health
